# Patient Record
Sex: Male | Born: 1960
Health system: Southern US, Community
[De-identification: ages and names within clinical notes are randomized; demographics above are authoritative.]

## PROBLEM LIST (undated history)

## (undated) DIAGNOSIS — E039 Hypothyroidism, unspecified: Secondary | ICD-10-CM

## (undated) HISTORY — DX: Hypothyroidism, unspecified: E03.9

---

## 1992-10-09 HISTORY — PX: TOTAL THYMECTOMY: SHX2546

## 2002-04-25 ENCOUNTER — Ambulatory Visit (HOSPITAL_COMMUNITY): Admission: RE | Admit: 2002-04-25 | Discharge: 2002-04-25 | Payer: Self-pay | Admitting: Internal Medicine

## 2009-04-15 ENCOUNTER — Ambulatory Visit: Payer: Self-pay | Admitting: Internal Medicine

## 2009-04-29 ENCOUNTER — Telehealth (INDEPENDENT_AMBULATORY_CARE_PROVIDER_SITE_OTHER): Payer: Self-pay | Admitting: Radiology

## 2009-05-03 ENCOUNTER — Ambulatory Visit: Payer: Self-pay

## 2009-05-03 ENCOUNTER — Encounter: Payer: Self-pay | Admitting: Cardiovascular Disease

## 2009-05-27 ENCOUNTER — Ambulatory Visit: Payer: Self-pay | Admitting: Internal Medicine

## 2009-05-27 LAB — CONVERTED CEMR LAB
Albumin: 4 g/dL (ref 3.5–5.2)
Basophils Absolute: 0 10*3/uL (ref 0.0–0.1)
Basophils Relative: 0.3 % (ref 0.0–3.0)
Bilirubin Urine: NEGATIVE
Bilirubin, Direct: 0 mg/dL (ref 0.0–0.3)
Calcium: 8.9 mg/dL (ref 8.4–10.5)
Cholesterol: 207 mg/dL — ABNORMAL HIGH (ref 0–200)
Eosinophils Absolute: 0.3 10*3/uL (ref 0.0–0.7)
GFR calc non Af Amer: 84.56 mL/min (ref >60)
Glucose, Bld: 93 mg/dL (ref 70–99)
HDL: 31.2 mg/dL — ABNORMAL LOW (ref >39.00)
Leukocytes, UA: NEGATIVE
Lymphocytes Relative: 34 % (ref 12.0–46.0)
MCHC: 34.1 g/dL (ref 30.0–36.0)
MCV: 92.2 fL (ref 78.0–100.0)
Monocytes Absolute: 0.5 10*3/uL (ref 0.1–1.0)
Neutrophils Relative %: 53.5 % (ref 43.0–77.0)
Nitrite: NEGATIVE
PSA: 0.71 ng/mL (ref 0.10–4.00)
Potassium: 3.9 meq/L (ref 3.5–5.1)
RBC: 4.75 M/uL (ref 4.22–5.81)
RDW: 11.7 % (ref 11.5–14.6)
Sodium: 139 meq/L (ref 135–145)
Total Protein: 7 g/dL (ref 6.0–8.3)
Triglycerides: 153 mg/dL — ABNORMAL HIGH (ref 0.0–149.0)
VLDL: 30.6 mg/dL (ref 0.0–40.0)

## 2009-06-01 ENCOUNTER — Ambulatory Visit: Payer: Self-pay | Admitting: Internal Medicine

## 2010-02-08 ENCOUNTER — Ambulatory Visit: Payer: Self-pay | Admitting: Internal Medicine

## 2010-02-08 DIAGNOSIS — Z8669 Personal history of other diseases of the nervous system and sense organs: Secondary | ICD-10-CM | POA: Insufficient documentation

## 2010-02-08 DIAGNOSIS — J069 Acute upper respiratory infection, unspecified: Secondary | ICD-10-CM | POA: Insufficient documentation

## 2010-02-08 DIAGNOSIS — G7000 Myasthenia gravis without (acute) exacerbation: Secondary | ICD-10-CM

## 2010-11-10 NOTE — Assessment & Plan Note (Signed)
Summary: cough/fever/njr   Vital Signs:  Patient profile:   50 year old male Weight:      210 pounds O2 Sat:      97 % on Room air Temp:     98.2 degrees F oral Pulse rate:   88 / minute BP sitting:   120 / 84  (right arm) Cuff size:   regular  Vitals Entered By: Romualdo Bolk, CMA (AAMA) (Feb 08, 2010 2:11 PM)  O2 Flow:  Room air CC: Coughing, congestion, fever x 2 days. Fever on 5/2 102.0, no sob or wheezing.   History of Present Illness: Christopher Blair comesin comes in today  for acute visit  acute onset of cough and then fever 102  and ur congestion  .. chills and then sweats  . went to work and then went home because was feeling badly. no hx of  Lung disease.    asthma or exposures  No    tick bites      Tried Antihistamines for drainage and ibuprofen. Hx pf myasthenia.  So avoids some meds. such as narcotics.  Preventive Screening-Counseling & Management  Alcohol-Tobacco     Smoking Status: quit  Caffeine-Diet-Exercise     Does Patient Exercise: no     Exercise (avg: min/session): 11:00  Current Medications (verified): 1)  None  Allergies (verified): 1)  ! * Narcotics  Past History:  Past medical, surgical, family and social histories (including risk factors) reviewed, and no changes noted (except as noted below).  Past Medical History: 1994 myasthenia Chest pain /stress echo  nl  2010  Past Surgical History: Reviewed history from 04/15/2009 and no changes required. thymectomy (myasthenia)  Family History: Reviewed history from 04/15/2009 and no changes required. father - deceasedMI 28 yo mother - deceased 69 yo Breast CA  Social History: Reviewed history from 04/15/2009 and no changes required. married Married Former Smoker Regular exercise-no [place] 5    Review of Systems       The patient complains of anorexia and fever.  The patient denies chest pain, syncope, dyspnea on exertion, peripheral edema, prolonged cough, headaches,  hemoptysis, abdominal pain, abnormal bleeding, enlarged lymph nodes, and angioedema.         no rashes   Physical Exam  General:  mildly ill non toxic in nad  congested  Head:  Normocephalic and atraumatic without obvious abnormalities. No apparent alopecia or balding. Eyes:  PERRL, EOMs full, conjunctiva clear  Ears:  R ear normal, L ear normal, and no external deformities.   Nose:  no external deformity and no external erythema.  congested   face non tender Mouth:  mild erythema no lesion Neck:  No deformities, masses, tender ac area Lungs:  Normal respiratory effort, chest expands symmetrically. Lungs are clear to auscultation, no crackles or wheezes.no dullness.   Heart:  Normal rate and regular rhythm. S1 and S2 normal without gallop, murmur, click, rub or other extra sounds. Msk:  no joint swelling, no joint warmth, and no redness over joints.   Pulses:  pulses intact without delay   Extremities:  no clubbing cyanosis or edema  Neurologic:  non focal  Skin:  turgor normal, color normal, no ecchymoses, and no petechiae.   Cervical Nodes:  No lymphadenopathy noted shoddy  Psych:  Oriented X3, good eye contact, not anxious appearing, and not depressed appearing.     Impression & Recommendations:  Problem # 1:  FEVER (ICD-780.60) flu like    illness  no  other alarm findings   will observe closely  and treat sympomatically   Problem # 2:  URI (ICD-465.9) Assessment: Comment Only  Problem # 3:  Hx of MYASTHENIA (ICD-358.1) Assessment: Comment Only  Patient Instructions: 1)  I think this a viral respiratory infection and fever should be resolved in another 48 hours  2)  call if fever not gone by then. For possible reevaluation  3)  symptoms rx for now and fluids and rest.

## 2010-11-10 NOTE — Letter (Signed)
Summary: Out of Work  [facility] [employer]  [address]   [place], [place] [address]   Phone: [phone]  Fax: [phone]    Feb 08, 2010   Employee:  JARRICK FJELD    To Whom It May Concern:   For Medical reasons, please excuse the above named employee from work for the following dates:  Start:   Feb 08 2010  End:   May 5th if fever is gone   If you need additional information, please feel free to contact our office.         Sincerely,    Madelin Headings MD

## 2011-02-24 ENCOUNTER — Ambulatory Visit (INDEPENDENT_AMBULATORY_CARE_PROVIDER_SITE_OTHER): Payer: Managed Care, Other (non HMO) | Admitting: Internal Medicine

## 2011-02-24 ENCOUNTER — Encounter: Payer: Self-pay | Admitting: Internal Medicine

## 2011-02-24 VITALS — BP 110/80 | HR 65 | Ht 68.0 in | Wt 216.0 lb

## 2011-02-24 DIAGNOSIS — R0789 Other chest pain: Secondary | ICD-10-CM

## 2011-02-24 DIAGNOSIS — H532 Diplopia: Secondary | ICD-10-CM

## 2011-02-24 DIAGNOSIS — R6884 Jaw pain: Secondary | ICD-10-CM

## 2011-02-24 DIAGNOSIS — M7710 Lateral epicondylitis, unspecified elbow: Secondary | ICD-10-CM

## 2011-02-24 DIAGNOSIS — IMO0002 Reserved for concepts with insufficient information to code with codable children: Secondary | ICD-10-CM

## 2011-02-26 ENCOUNTER — Encounter: Payer: Self-pay | Admitting: Internal Medicine

## 2011-02-26 DIAGNOSIS — IMO0002 Reserved for concepts with insufficient information to code with codable children: Secondary | ICD-10-CM | POA: Insufficient documentation

## 2011-02-26 DIAGNOSIS — R6884 Jaw pain: Secondary | ICD-10-CM | POA: Insufficient documentation

## 2011-02-26 DIAGNOSIS — H532 Diplopia: Secondary | ICD-10-CM | POA: Insufficient documentation

## 2011-02-26 DIAGNOSIS — R0789 Other chest pain: Secondary | ICD-10-CM | POA: Insufficient documentation

## 2011-02-26 NOTE — Assessment & Plan Note (Signed)
not associated with chest discomfort. Appears to be musculoskeletal in etiology

## 2011-02-26 NOTE — Assessment & Plan Note (Signed)
Neurologically nonfocal. History of myasthenia gravis however has been asymptomatic. Patient to call his personal physician for appointment to evaluate. If workup unrevealing proceed with neurology consult

## 2011-02-26 NOTE — Progress Notes (Signed)
  Subjective:    Patient ID: Christopher Blair, male    DOB: [birth year], 50 y.o.   MRN: [id number]  HPI Patient presents to clinic for evaluation of double vision and chest tightness. Reports double vision intermittently lasting several seconds followed by spontaneous resolution. States it occurs when he is tired. Denies other neurologic symptoms such as dysarthria, numbness, tingling, focal weakness. Has history of myasthenia gravis however has been asymptomatic for some time and was released by neurology.  Complains of intermittent episodes of chest tightness located left anterior chest. Symptoms are nonexertional without radiation shortness of breath or diaphoresis. May last up to one hour. Denies GERD symptoms. States it occurs during times of increased stress.  Notes last week during the night on one single occasion had medial leg pain it appeared to be possibly radicular. Had no back pain, paresthesias or leg weakness. Resolved spontaneously and has had no further symptoms.  Notes recent intermittent jaw pain bilaterally roughly in the distribution of the masseter muscle. It is nonexertional and not associated with chest discomfort. It is not reproducible nor positional.  Also has bilateral lateral epicondylar pain worsened with certain movements. There has been no injury or trauma. No other alleviating or exacerbating factors.  Does note significant increased stress at work and home.  Reviewed past medical history, medications and allergies    Review of Systems see history of present illness     Objective:   Physical Exam    Physical Exam  Vitals reviewed. Constitutional:  appears well-developed and well-nourished. No distress.  HENT: jaw nontender without bony abnormality or mass. Head: Normocephalic and atraumatic.  Right Ear: Tympanic membrane, external ear and ear canal normal.  Left Ear: Tympanic membrane, external ear and ear canal normal.  Nose: Nose normal.    Mouth/Throat: Oropharynx is clear and moist. No oropharyngeal exudate.  Eyes: Conjunctivae and EOM are normal. Pupils are equal, round, and reactive to light. Right eye exhibits no discharge. Left eye exhibits no discharge. No scleral icterus.  Neck: Neck supple. No thyromegaly present.  Cardiovascular: Normal rate, regular rhythm and normal heart sounds.  Exam reveals no gallop and no friction rub.   No murmur heard. Pulmonary/Chest: Effort normal and breath sounds normal. No respiratory distress.  has no wheezes.  has no rales.  Lymphadenopathy:   no cervical adenopathy.  Neurological:  is alert. Cranial nerves II through XII grossly intact. Strength 5/5. Gait normal Skin: Skin is warm and dry.  not diaphoretic.  Psychiatric: normal mood and affect.      Assessment & Plan:

## 2011-02-26 NOTE — Assessment & Plan Note (Signed)
Begin OTC anti-inflammatories with food and no other anti-inflammatories

## 2011-02-26 NOTE — Assessment & Plan Note (Signed)
EKG obtained demonstrates normal sinus rhythm with a rate of 60. Normal intervals and axis and no evidence of acute ischemic change. Symptoms atypical. Patient notes association with stress.

## 2012-02-25 ENCOUNTER — Ambulatory Visit (INDEPENDENT_AMBULATORY_CARE_PROVIDER_SITE_OTHER): Payer: Managed Care, Other (non HMO) | Admitting: Physician Assistant

## 2012-02-25 VITALS — BP 118/72 | HR 84 | Temp 98.9°F | Resp 18 | Ht 67.0 in | Wt 215.0 lb

## 2012-02-25 DIAGNOSIS — R059 Cough, unspecified: Secondary | ICD-10-CM

## 2012-02-25 DIAGNOSIS — R05 Cough: Secondary | ICD-10-CM

## 2012-02-25 DIAGNOSIS — M25519 Pain in unspecified shoulder: Secondary | ICD-10-CM

## 2012-02-25 DIAGNOSIS — J309 Allergic rhinitis, unspecified: Secondary | ICD-10-CM

## 2012-02-25 DIAGNOSIS — M25512 Pain in left shoulder: Secondary | ICD-10-CM

## 2012-02-25 MED ORDER — GUAIFENESIN ER 1200 MG PO TB12: 1.0000 | ORAL_TABLET | Freq: Two times a day (BID) | ORAL | Status: DC | PRN

## 2012-02-25 MED ORDER — BENZONATATE 100 MG PO CAPS: 100.0000 mg | ORAL_CAPSULE | Freq: Three times a day (TID) | ORAL | Status: DC | PRN

## 2012-02-25 MED ORDER — BENZONATATE 100 MG PO CAPS: 100.0000 mg | ORAL_CAPSULE | Freq: Three times a day (TID) | ORAL | Status: AC | PRN

## 2012-02-25 MED ORDER — MELOXICAM 15 MG PO TABS: 15.0000 mg | ORAL_TABLET | Freq: Every day | ORAL | Status: DC

## 2012-02-25 MED ORDER — IPRATROPIUM BROMIDE 0.03 % NA SOLN: 2.0000 | Freq: Two times a day (BID) | NASAL | Status: DC

## 2012-02-25 NOTE — Patient Instructions (Addendum)
Get lots of rest and drink at least 64 ounces of water daily.  Use the cetirizine you have at home. You may also take a dose of Benadryl at bedtime if needed. If the shoulder pain does not resolve, return for re-evaluation.

## 2012-02-25 NOTE — Progress Notes (Signed)
  Subjective:    Patient ID: Christopher Blair, male    DOB: 04/05/1961, 51 y.o.   MRN: [id number]  HPI Nose started dripping two days ago.  Throat felt scratchy.  Post-nasal drainage. Cough is non-productive.  Mowed the lawn yesterday and symptoms worsened. Bilateral ear pain. Feels terrible. H/O myasthenia gravis and notes that since then, while he rarely gets sick, when he does, "it's really bad."  Subjective fever, chills.  No GU/GI symptoms.  Left shoulder pain with raising the arm x 1 month. No specific trauma or injury.  Pain begins when raises arm above 90 degrees.  No pain radiating down the arm, no paresthesias, no weakness.  Review of Systems As above.    Objective:   Physical Exam Vital signs noted. Well-developed, well nourished WM who is awake, alert and oriented, in NAD. HEENT: [place]/AT, PERRL, EOMI.  Sclera and conjunctiva are clear.  EAC are patent, TMs are normal in appearance. Nasal mucosa is pink and moist, but congested. OP is clear. Neck: supple, non-tender, no lymphadenopathy, thyromegaly. Heart: RRR, no murmur Lungs: CTA Extremities: no cyanosis, clubbing or edema. The left shoulder is normal in appearance.  There is no tenderness on palpation.  Anterior shoulder pain with raising the arm laterally to 90 degrees, but not anteriorly or posteriorly. Skin: warm and dry without rash.     Assessment & Plan:   1. AR (allergic rhinitis)  Guaifenesin (MUCINEX MAXIMUM STRENGTH) 1200 MG TB12, ipratropium (ATROVENT) 0.03 % nasal spray.  2. Cough  benzonatate (TESSALON) 100 MG capsule  3. Shoulder pain, left  meloxicam (MOBIC) 15 MG tablet.

## 2012-03-06 ENCOUNTER — Telehealth: Payer: Self-pay | Admitting: Internal Medicine

## 2012-03-06 NOTE — Telephone Encounter (Signed)
Pt has severe cough, difficult to talk and catch breath. Req call from nurse asap. Pt is currently taking tessalon pearls, cough syrup and nasal spray.

## 2012-03-06 NOTE — Telephone Encounter (Signed)
Spoke with patient and an appointment made 

## 2012-03-07 ENCOUNTER — Encounter: Payer: Self-pay | Admitting: Family Medicine

## 2012-03-07 ENCOUNTER — Telehealth: Payer: Self-pay | Admitting: Internal Medicine

## 2012-03-07 ENCOUNTER — Ambulatory Visit (INDEPENDENT_AMBULATORY_CARE_PROVIDER_SITE_OTHER): Payer: Managed Care, Other (non HMO) | Admitting: Family Medicine

## 2012-03-07 VITALS — BP 130/88 | HR 90 | Temp 98.6°F | Resp 16 | Wt 211.0 lb

## 2012-03-07 DIAGNOSIS — R05 Cough: Secondary | ICD-10-CM

## 2012-03-07 DIAGNOSIS — R059 Cough, unspecified: Secondary | ICD-10-CM

## 2012-03-07 MED ORDER — HYDROCODONE-HOMATROPINE 5-1.5 MG/5ML PO SYRP: 5.0000 mL | ORAL_SOLUTION | Freq: Four times a day (QID) | ORAL | Status: AC | PRN

## 2012-03-07 NOTE — Patient Instructions (Signed)

## 2012-03-07 NOTE — Telephone Encounter (Signed)
Patient has already tried Tessalon without relief. Try over-the-counter Delsym cough syrup.  Only other prescription would be things like Tussionex which he also could not take if unable to take Hydromet

## 2012-03-07 NOTE — Progress Notes (Signed)
  Subjective:    Patient ID: Christopher Blair, male    DOB: 1961-06-22, 51 y.o.   MRN: [id number]  HPI  Cough one and one half week duration. Initially started with headache, sore throat, chills, night sweats. Seen in urgent care and treated for possible allergies. He had vomiting and nausea with Mucinex. Atrovent nasal spray but has no nasal symptoms at this time. Cough is dry. Worse at night. No relief with Tessalon. No postnasal drip. No GERD. Nonsmoker. Denies dyspnea or hemoptysis. No pleuritic pain. No history of asthma.   Review of Systems  Constitutional: Negative for fever, chills, activity change, appetite change, fatigue and unexpected weight change.  HENT: Negative for ear pain, congestion, sore throat and trouble swallowing.   Respiratory: Positive for cough. Negative for shortness of breath, wheezing and stridor.   Cardiovascular: Negative for chest pain and leg swelling.  Gastrointestinal: Negative for abdominal pain.  Musculoskeletal: Negative for arthralgias.  Skin: Negative for rash.  Neurological: Negative for syncope and headaches.  Hematological: Negative for adenopathy.       Objective:   Physical Exam  Constitutional: He is oriented to person, place, and time. He appears well-developed and well-nourished. No distress.  HENT:  Head: Normocephalic and atraumatic.  Eyes: Pupils are equal, round, and reactive to light.  Neck: Normal range of motion. Neck supple. No thyromegaly present.  Cardiovascular: Normal rate and regular rhythm.   No murmur heard. Pulmonary/Chest: Effort normal. No stridor. No respiratory distress. He has no wheezes. He has no rales.  Abdominal: Soft. Bowel sounds are normal. There is no tenderness.  Musculoskeletal: He exhibits no edema.  Lymphadenopathy:    He has no cervical adenopathy.  Neurological: He is alert and oriented to person, place, and time.  Psychiatric: He has a normal mood and affect.          Assessment & Plan:    Cough. Suspect residual from acute viral bronchitis. Hycodan cough syrup 1 teaspoon every 6 hours when necessary. Followup promptly for any fever or worsening/persistent symptoms

## 2012-03-07 NOTE — Telephone Encounter (Signed)
Pt was prescribed hycodan but is unable to take because he has been dx with myastenia.  Please advise of an alternative.

## 2012-03-08 NOTE — Telephone Encounter (Signed)
This was answered yesterday.  Try Delsym cough syrup.  Pt cant take Hydrocodone and has already tried [employer].

## 2012-03-08 NOTE — Telephone Encounter (Signed)
Spoke with patient.

## 2012-03-08 NOTE — Telephone Encounter (Signed)
[facility] on [address] called and said that pt is allergic to Codeine and can not take Hycodan Syrup. P

## 2012-09-13 ENCOUNTER — Encounter: Payer: Self-pay | Admitting: Internal Medicine

## 2012-09-13 ENCOUNTER — Ambulatory Visit (INDEPENDENT_AMBULATORY_CARE_PROVIDER_SITE_OTHER): Payer: Managed Care, Other (non HMO) | Admitting: Internal Medicine

## 2012-09-13 VITALS — BP 146/90 | HR 76 | Temp 98.0°F | Resp 18 | Wt 205.0 lb

## 2012-09-13 DIAGNOSIS — M542 Cervicalgia: Secondary | ICD-10-CM

## 2012-09-13 MED ORDER — CELECOXIB 200 MG PO CAPS: 200.0000 mg | ORAL_CAPSULE | Freq: Two times a day (BID) | ORAL | Status: DC

## 2012-09-13 MED ORDER — METHYLPREDNISOLONE ACETATE 80 MG/ML IJ SUSP
80.0000 mg | Freq: Once | INTRAMUSCULAR | Status: AC
  Administered 2012-09-13: 80 mg via INTRAMUSCULAR

## 2012-09-13 NOTE — Patient Instructions (Signed)
Consider a soft cervical collar Celebrex twice daily Gentle heat and massage therapy  Call if unimproved

## 2012-09-13 NOTE — Progress Notes (Signed)
Subjective:    Patient ID: Christopher Blair, male    DOB: 1961/05/29, 51 y.o.   MRN: [id number]  HPI  52 year old patient who presents with a 4 to six-week history of neck pain and stiffness. No history of trauma. He does state that he is having slightly more increased situational stress than normal. He has remote history of myasthenia gravis that has been stable.  Past Medical History  Diagnosis Date  . Myasthenia gravis 1994    in remission since 1996    History   Social History  . Marital Status: Married    Spouse Name: N/A    Number of Children: N/A  . Years of Education: N/A   Occupational History  . Not on file.   Social History Main Topics  . Smoking status: Never Smoker   . Smokeless tobacco: Not on file  . Alcohol Use: Yes  . Drug Use: No  . Sexually Active:    Other Topics Concern  . Not on file   Social History Narrative  . No narrative on file    Past Surgical History  Procedure Date  . Total thymectomy 1994    Family History  Problem Relation Age of Onset  . Cancer Mother 70    breast ca  . Heart disease Father     AMI    Allergies  Allergen Reactions  . Mucinex (Guaifenesin Er) Other (See Comments)    vomiting    Current Outpatient Prescriptions on File Prior to Visit  Medication Sig Dispense Refill  . Guaifenesin (MUCINEX MAXIMUM STRENGTH) 1200 MG TB12 Take 1 tablet (1,200 mg total) by mouth every 12 (twelve) hours as needed.  14 tablet  1  . ipratropium (ATROVENT) 0.03 % nasal spray Place 2 sprays into the nose 2 (two) times daily.  30 mL  0  . meloxicam (MOBIC) 15 MG tablet Take 1 tablet (15 mg total) by mouth daily.  30 tablet  0    BP 146/90  Pulse 76  Temp 98 F (36.7 C) (Oral)  Resp 18  Wt 205 lb (92.987 kg)       Review of Systems  Constitutional: Negative for fever, chills, appetite change and fatigue.  HENT: Negative for hearing loss, ear pain, congestion, sore throat, trouble swallowing, neck stiffness, dental  problem, voice change and tinnitus.   Eyes: Negative for pain, discharge and visual disturbance.  Respiratory: Negative for cough, chest tightness, wheezing and stridor.   Cardiovascular: Negative for chest pain, palpitations and leg swelling.  Gastrointestinal: Negative for nausea, vomiting, abdominal pain, diarrhea, constipation, blood in stool and abdominal distention.  Genitourinary: Negative for urgency, hematuria, flank pain, discharge, difficulty urinating and genital sores.  Musculoskeletal: Negative for myalgias, back pain (neck pain), joint swelling, arthralgias and gait problem.  Skin: Negative for rash.  Neurological: Negative for dizziness, syncope, speech difficulty, weakness, numbness and headaches.  Hematological: Negative for adenopathy. Does not bruise/bleed easily.  Psychiatric/Behavioral: Negative for behavioral problems and dysphoric mood. The patient is not nervous/anxious.        Objective:   Physical Exam  Constitutional: He appears well-developed and well-nourished. No distress.       Repeat blood pressure larger cuff 120/80  Neck:       Range of motion of the neck chest and. He had full flexion and extension without discomfort. Range of motion was limited with lateral movement to the left as well as lateral movement to the right. The left affected more than the right;  pain was noted in the posterolateral neck regions with head turning          Assessment & Plan:  Neck pain.  Recommend gentle massage heat therapy. Samples of Celebrex dispensed. Will treat with Depo-Medrol 80 he will also try a soft cervical collar. If unimproved will recontact the office

## 2012-11-14 ENCOUNTER — Other Ambulatory Visit (INDEPENDENT_AMBULATORY_CARE_PROVIDER_SITE_OTHER): Payer: Managed Care, Other (non HMO)

## 2012-11-14 ENCOUNTER — Encounter: Payer: Self-pay | Admitting: Family Medicine

## 2012-11-14 ENCOUNTER — Ambulatory Visit (INDEPENDENT_AMBULATORY_CARE_PROVIDER_SITE_OTHER): Payer: Managed Care, Other (non HMO) | Admitting: Family Medicine

## 2012-11-14 VITALS — BP 120/80 | Temp 99.1°F | Wt 209.0 lb

## 2012-11-14 DIAGNOSIS — M7630 Iliotibial band syndrome, unspecified leg: Secondary | ICD-10-CM

## 2012-11-14 DIAGNOSIS — Z0000 Encounter for general adult medical examination without abnormal findings: Secondary | ICD-10-CM

## 2012-11-14 DIAGNOSIS — M629 Disorder of muscle, unspecified: Secondary | ICD-10-CM

## 2012-11-14 LAB — HEPATIC FUNCTION PANEL
Alkaline Phosphatase: 71 U/L (ref 39–117)
Bilirubin, Direct: 0.2 mg/dL (ref 0.0–0.3)
Total Bilirubin: 1.4 mg/dL — ABNORMAL HIGH (ref 0.3–1.2)
Total Protein: 7.2 g/dL (ref 6.0–8.3)

## 2012-11-14 LAB — TSH: TSH: 5.43 u[IU]/mL (ref 0.35–5.50)

## 2012-11-14 LAB — BASIC METABOLIC PANEL
Calcium: 8.9 mg/dL (ref 8.4–10.5)
Chloride: 104 mEq/L (ref 96–112)
Creatinine, Ser: 1.1 mg/dL (ref 0.4–1.5)

## 2012-11-14 LAB — CBC WITH DIFFERENTIAL/PLATELET
Basophils Relative: 0.6 % (ref 0.0–3.0)
Eosinophils Relative: 2.9 % (ref 0.0–5.0)
Lymphocytes Relative: 21.4 % (ref 12.0–46.0)
MCV: 90.9 fl (ref 78.0–100.0)
Monocytes Relative: 16.7 % — ABNORMAL HIGH (ref 3.0–12.0)
Neutrophils Relative %: 58.4 % (ref 43.0–77.0)
RBC: 5.03 Mil/uL (ref 4.22–5.81)
WBC: 6.1 10*3/uL (ref 4.5–10.5)

## 2012-11-14 LAB — PSA: PSA: 0.84 ng/mL (ref 0.10–4.00)

## 2012-11-14 LAB — LIPID PANEL
LDL Cholesterol: 121 mg/dL — ABNORMAL HIGH (ref 0–99)
Total CHOL/HDL Ratio: 6
Triglycerides: 165 mg/dL — ABNORMAL HIGH (ref 0.0–149.0)

## 2012-11-14 LAB — POCT URINALYSIS DIPSTICK
Glucose, UA: NEGATIVE
Ketones, UA: NEGATIVE
Spec Grav, UA: >=1.03

## 2012-11-14 NOTE — Patient Instructions (Addendum)
Do exercises provided daily: 1) stretches (circled) for first week 2) add rehab exercises (starred) with the stretches the 2nd week and continue until your follow up appointment  Heat for 15 minutes twice daily  Can use Tylenol 1000mg  2-3 times daily or Ibuprofen 400 mg 2-3 times daily if needed for the pain  Follow up when you come for your physical exam in about 1 month

## 2012-11-14 NOTE — Progress Notes (Signed)
Chief Complaint  Patient presents with  . Leg Pain    right thigh    HPI:  Acute visit for leg pain: -started 2 weeks ago - can not think of initiating event -R lateral upper leg -started as a stabbing pain, less now but still a throbbing pain -intermittently sometimes and sometimes constant -nothing makes it better or worse -occ numbness in R lat foot after working outside in the cold in steel toed boots outside all day -very active at work and does cycling -no fevers, chills, unintentional weight loss, weakness, or other complaints, uine or bowel incontinence   ROS: See pertinent positives and negatives per HPI.  Past Medical History  Diagnosis Date  . Myasthenia gravis 1994    in remission since 1996    Family History  Problem Relation Age of Onset  . Cancer Mother 78    breast ca  . Heart disease Father     AMI    History   Social History  . Marital Status: Married    Spouse Name: N/A    Number of Children: N/A  . Years of Education: N/A   Social History Main Topics  . Smoking status: Never Smoker   . Smokeless tobacco: None  . Alcohol Use: Yes  . Drug Use: No  . Sexually Active:    Other Topics Concern  . None   Social History Narrative  . None    Current outpatient prescriptions:celecoxib (CELEBREX) 200 MG capsule, Take 1 capsule (200 mg total) by mouth 2 (two) times daily., Disp: , Rfl: ;  Guaifenesin (MUCINEX MAXIMUM STRENGTH) 1200 MG TB12, Take 1 tablet (1,200 mg total) by mouth every 12 (twelve) hours as needed., Disp: 14 tablet, Rfl: 1;  ibuprofen (ADVIL,MOTRIN) 200 MG tablet, Take 400 mg by mouth every 6 (six) hours as needed., Disp: , Rfl:  ipratropium (ATROVENT) 0.03 % nasal spray, Place 2 sprays into the nose 2 (two) times daily., Disp: 30 mL, Rfl: 0  EXAM:  Filed Vitals:   11/14/12 1337  BP: 120/80  Temp: 99.1 F (37.3 C)    There is no height on file to calculate BMI.  GENERAL: vitals reviewed and listed above, alert, oriented,  appears well hydrated and in no acute distress  HEENT: atraumatic, conjunttiva clear, no obvious abnormalities on inspection of external nose and ears  NECK: no obvious masses on inspection  LUNGS: clear to auscultation bilaterally, no wheezes, rales or rhonchi, good air movement  CV: HRRR, no peripheral edema  MS: moves all extremities without noticeable abnormality Normal Gait Normal inspection of back, no obvious scoliosis or leg length descrepancy No bony TTP Soft tissue TTP at: IT band distal 1/3 on R -/+ tests: neg trendelenburg,-facet loading, -SLRT, -CLRT, -FABER, -FADIR Normal muscle strength, sensation to light touch and DTRs in LEs bilaterally  PSYCH: pleasant and cooperative, no obvious depression or anxiety  ASSESSMENT AND PLAN:  Discussed the following assessment and plan:  1. IT band syndrome    -exam c/w IT band strain or tendonopathy -HEP provided -supportive care and rehab care per below -Patient advised to return or notify a doctor immediately if symptoms worsen or persist or new concerns arise.  Patient Instructions  Do exercises provided daily: 1) stretches (circled) for first week 2) add rehab exercises (starred) with the stretches the 2nd week and continue until your follow up appointment  Heat for 15 minutes twice daily  Follow up when you come for your physical exam in about 1 month  Colin Benton R.

## 2012-11-20 ENCOUNTER — Encounter: Payer: Managed Care, Other (non HMO) | Admitting: Family Medicine

## 2012-11-20 ENCOUNTER — Telehealth: Payer: Self-pay | Admitting: Internal Medicine

## 2012-11-20 NOTE — Telephone Encounter (Signed)
Patient in office last week and had Labs done including urinalysis (told he had blood in his urine).  Calling to get lab results. Please review and contact patient with Lab results at [phone].

## 2012-11-25 ENCOUNTER — Other Ambulatory Visit (INDEPENDENT_AMBULATORY_CARE_PROVIDER_SITE_OTHER): Payer: Managed Care, Other (non HMO)

## 2012-11-25 DIAGNOSIS — R319 Hematuria, unspecified: Secondary | ICD-10-CM

## 2012-11-25 LAB — POCT URINALYSIS DIPSTICK
Bilirubin, UA: NEGATIVE
Glucose, UA: NEGATIVE
Ketones, UA: NEGATIVE
Spec Grav, UA: <=1.005

## 2012-11-25 NOTE — Telephone Encounter (Signed)
Per Dr Cato Mulligan, labs ok but needs to come in for a repeat urine and culture.  Pt aware, appt scheduled for today

## 2012-11-27 LAB — URINE CULTURE
Colony Count: NO GROWTH
Organism ID, Bacteria: NO GROWTH

## 2012-12-06 ENCOUNTER — Telehealth: Payer: Self-pay | Admitting: Internal Medicine

## 2012-12-06 NOTE — Telephone Encounter (Signed)
Please call patient back w/results of urine culture.

## 2012-12-06 NOTE — Telephone Encounter (Signed)
Gave pt negative urine culture results but he wants to know what would've caused the hematuria

## 2012-12-08 NOTE — Telephone Encounter (Signed)
There is no specific explanation If he would like another opinion, refer to urology

## 2012-12-09 NOTE — Telephone Encounter (Signed)
Pt aware, he will just wait till his physical and discuss it with Dr Cato Mulligan

## 2013-01-07 ENCOUNTER — Telehealth: Payer: Self-pay | Admitting: Internal Medicine

## 2013-01-07 ENCOUNTER — Encounter: Payer: Managed Care, Other (non HMO) | Admitting: Internal Medicine

## 2013-01-07 NOTE — Telephone Encounter (Addendum)
Pt missed his CPX this am. Would like earlier appt than June which is first thing available. Pt said he got tied up at work.Pls advise. Pt put on wait list.

## 2013-01-07 NOTE — Telephone Encounter (Signed)
Do you want to work pt in somewhere?  He no showed this morning

## 2013-01-07 NOTE — Telephone Encounter (Signed)
See if padonda can work him in

## 2013-01-08 NOTE — Telephone Encounter (Signed)
Called pt, mailbox full, could not accept messages.

## 2013-01-17 NOTE — Telephone Encounter (Signed)
Mailbox full/kh

## 2013-03-12 ENCOUNTER — Telehealth: Payer: Self-pay | Admitting: Internal Medicine

## 2013-03-12 NOTE — Telephone Encounter (Addendum)
Pt would like to switch to Dr Caryl Never from Dr Cato Mulligan? Is that OK with you Dr Cato Mulligan? Is that OK with you Dr Caryl Never?  The rest of his family sees you Dr Caryl Never.  Also, pt has been trying since Feb. for a physical and it has been rescheduled 3 times.

## 2013-03-12 NOTE — Telephone Encounter (Signed)
Ok per Dr Swords 

## 2013-03-12 NOTE — Telephone Encounter (Signed)
OK with me.

## 2013-03-14 NOTE — Telephone Encounter (Signed)
appt set. Pt happy.

## 2013-03-19 ENCOUNTER — Encounter: Payer: Managed Care, Other (non HMO) | Admitting: Internal Medicine

## 2013-03-26 ENCOUNTER — Encounter: Payer: Managed Care, Other (non HMO) | Admitting: Family Medicine

## 2013-03-28 ENCOUNTER — Encounter: Payer: Self-pay | Admitting: Internal Medicine

## 2013-03-28 ENCOUNTER — Ambulatory Visit (INDEPENDENT_AMBULATORY_CARE_PROVIDER_SITE_OTHER): Payer: Managed Care, Other (non HMO) | Admitting: Internal Medicine

## 2013-03-28 VITALS — BP 110/82 | HR 72 | Temp 98.0°F | Ht 68.5 in | Wt 203.0 lb

## 2013-03-28 DIAGNOSIS — Z0000 Encounter for general adult medical examination without abnormal findings: Secondary | ICD-10-CM

## 2013-03-28 DIAGNOSIS — M25579 Pain in unspecified ankle and joints of unspecified foot: Secondary | ICD-10-CM

## 2013-03-28 DIAGNOSIS — R200 Anesthesia of skin: Secondary | ICD-10-CM

## 2013-03-28 DIAGNOSIS — R209 Unspecified disturbances of skin sensation: Secondary | ICD-10-CM

## 2013-03-28 LAB — CBC WITH DIFFERENTIAL/PLATELET
Basophils Relative: 0.6 % (ref 0.0–3.0)
Eosinophils Absolute: 0.2 10*3/uL (ref 0.0–0.7)
Eosinophils Relative: 2.4 % (ref 0.0–5.0)
HCT: 45.1 % (ref 39.0–52.0)
Lymphs Abs: 1.8 10*3/uL (ref 0.7–4.0)
MCHC: 33.8 g/dL (ref 30.0–36.0)
MCV: 93.9 fl (ref 78.0–100.0)
Monocytes Absolute: 0.6 10*3/uL (ref 0.1–1.0)
Neutrophils Relative %: 65 % (ref 43.0–77.0)
Platelets: 281 10*3/uL (ref 150.0–400.0)
RBC: 4.8 Mil/uL (ref 4.22–5.81)
WBC: 7.7 10*3/uL (ref 4.5–10.5)

## 2013-03-28 LAB — POCT URINALYSIS DIPSTICK
Bilirubin, UA: NEGATIVE
Glucose, UA: NEGATIVE
Ketones, UA: NEGATIVE
Leukocytes, UA: NEGATIVE
Nitrite, UA: NEGATIVE

## 2013-03-28 LAB — HEPATIC FUNCTION PANEL
ALT: 23 U/L (ref 0–53)
Alkaline Phosphatase: 70 U/L (ref 39–117)
Bilirubin, Direct: 0.1 mg/dL (ref 0.0–0.3)
Total Bilirubin: 1.2 mg/dL (ref 0.3–1.2)
Total Protein: 7.5 g/dL (ref 6.0–8.3)

## 2013-03-28 LAB — LDL CHOLESTEROL, DIRECT: Direct LDL: 136.2 mg/dL

## 2013-03-28 LAB — BASIC METABOLIC PANEL
CO2: 28 mEq/L (ref 19–32)
Chloride: 104 mEq/L (ref 96–112)
Creatinine, Ser: 1 mg/dL (ref 0.4–1.5)
Sodium: 140 mEq/L (ref 135–145)

## 2013-03-28 LAB — LIPID PANEL
Total CHOL/HDL Ratio: 7
Triglycerides: 301 mg/dL — ABNORMAL HIGH (ref 0.0–149.0)

## 2013-03-28 NOTE — Progress Notes (Signed)
Patient ID: Christopher Blair, male   DOB: 1960/10/27, 52 y.o.   MRN: [id number]  Comes in for cpx but has some strange complaints  1)- numbness right side of face associated with slurred speech and dizziness- ongoing for 2 weeks.   2) bilateral , intermittent ankle pain for 3 months.   3) hx myasthenia  Past Medical History  Diagnosis Date  . Myasthenia gravis 1994    in remission since 1996    History   Social History  . Marital Status: Married    Spouse Name: N/A    Number of Children: N/A  . Years of Education: N/A   Occupational History  . Not on file.   Social History Main Topics  . Smoking status: Never Smoker   . Smokeless tobacco: Not on file  . Alcohol Use: Yes  . Drug Use: No  . Sexually Active:    Other Topics Concern  . Not on file   Social History Narrative  . No narrative on file    Past Surgical History  Procedure Laterality Date  . Total thymectomy  1994    Family History  Problem Relation Age of Onset  . Cancer Mother 70    breast ca  . Heart disease Father     AMI    Allergies  Allergen Reactions  . Mucinex (Guaifenesin Er) Other (See Comments)    vomiting    Current Outpatient Prescriptions on File Prior to Visit  Medication Sig Dispense Refill  . ibuprofen (ADVIL,MOTRIN) 200 MG tablet Take 400 mg by mouth every 6 (six) hours as needed.       No current facility-administered medications on file prior to visit.     patient denies chest pain, shortness of breath, orthopnea. Denies lower extremity edema, abdominal pain, change in appetite, change in bowel movements. Patient denies rashes, musculoskeletal complaints. No other specific complaints in a complete review of systems.   BP 110/82  Pulse 72  Temp(Src) 98 F (36.7 C) (Oral)  Ht 5' 8.5" (1.74 m)  Wt 203 lb (92.08 kg)  BMI 30.41 kg/m2  well-developed well-nourished male in no acute distress. HEENT exam atraumatic, normocephalic, neck supple without jugular venous  distention. Chest clear to auscultation cardiac exam S1-S2 are regular. Abdominal exam overweight with bowel sounds, soft and nontender. Extremities no edema. Neurologic exam is alert with a normal gait.  Well visit- health maint utd-  Ankle pain- bilateral. Left ankle hurts constantly and right ankle is intermittent  Neurologic complaints--? Cause. No s/s of bell's palsy. Probably should review with neurology. ? Related to myasthenia.

## 2013-04-01 ENCOUNTER — Telehealth: Payer: Self-pay | Admitting: Internal Medicine

## 2013-04-01 NOTE — Telephone Encounter (Signed)
PT is calling to inquire about his lab results from 03/28/13. Please assist.

## 2013-04-02 ENCOUNTER — Encounter: Payer: Self-pay | Admitting: Internal Medicine

## 2013-04-02 NOTE — Telephone Encounter (Signed)
Gave pt normal lab results. 

## 2013-04-21 ENCOUNTER — Other Ambulatory Visit: Payer: Self-pay | Admitting: *Deleted

## 2013-04-21 MED ORDER — LEVOTHYROXINE SODIUM 50 MCG PO TABS: 50.0000 ug | ORAL_TABLET | Freq: Every day | ORAL | Status: DC

## 2013-04-22 ENCOUNTER — Ambulatory Visit (INDEPENDENT_AMBULATORY_CARE_PROVIDER_SITE_OTHER): Payer: Self-pay | Admitting: Neurology

## 2013-04-22 ENCOUNTER — Encounter: Payer: Self-pay | Admitting: Neurology

## 2013-04-22 VITALS — BP 110/70 | HR 80 | Temp 97.7°F | Ht 68.0 in | Wt 203.0 lb

## 2013-04-22 DIAGNOSIS — R200 Anesthesia of skin: Secondary | ICD-10-CM

## 2013-04-22 DIAGNOSIS — R42 Dizziness and giddiness: Secondary | ICD-10-CM

## 2013-04-22 DIAGNOSIS — R209 Unspecified disturbances of skin sensation: Secondary | ICD-10-CM

## 2013-04-22 DIAGNOSIS — G733 Myasthenic syndromes in other diseases classified elsewhere: Secondary | ICD-10-CM

## 2013-04-22 NOTE — Patient Instructions (Addendum)
I would first like to rule out any abnormalities in the brain that may cause this, as well as investigate for simple partial seizures. 1.  MRI of brain with and without contrast with the [facility] protocol 2.  Sleep-deprived EEG.  Find out ASAP if you still have health insurance and if you do, contact us immediately so we can schedule these tests.

## 2013-04-22 NOTE — Progress Notes (Signed)
NEUROLOGY CONSULTATION NOTE  Christopher Blair MRN: [id number] DOB: [birth year]  Referring provider: Dr. Cato Mulligan Primary care provider: Dr. Cato Mulligan  Reason for consult:  Episodic right facial paresthesias and lightheadedness.  HISTORY OF PRESENT ILLNESS: Christopher Blair is a 52 y.o. male with history of myasthenia gravis who presents with right facial numbness associated with slurred speech and dizziness.  Symptoms started approximately 2 months ago. He will note a numbness and tingling sensation on the tip of his nose that will travel down the right side of his nose and his nasolabial fold. This would be associated with a lightheadedness. It would usually last approximately a couple of minutes. Initially it occurred twice a day and has more recently occurred up to 12 times a day.  There is no associated headaches, nausea ,vertigo,visual disturbance, strange taste, epigastric rising, or loss of consciousness. On one occasion, he had associated slurred speech and had to hold on to keep his balance.  He didn't start any new medications prior to onset. He did not have any injury. He endorses some anxiety, mainly about work.  He was recently diagnosed with hypothyroidism.    Patient has history of myasthenia gravis s/p thymectomy, in remission since 1996.  Not on cholinesterase inhibitors.  He does have a history of migraines that only occur rarely.  His son has a history of migraines and his daughter has history of anxiety, both take medications. There is no other family history of neurological problems. He has no prior history of seizures.  Unfortunately, Christopher Blair found out that he lost his job today. He is not sure if he still has health insurance coverage after today.  Labs 03/28/13: TSH 8.25, ESR 16.  PAST MEDICAL HISTORY: Past Medical History  Diagnosis Date  . Myasthenia gravis 1994    in remission since 1996    PAST SURGICAL HISTORY: Past Surgical History  Procedure Laterality  Date  . Total thymectomy  1994    MEDICATIONS: Current Outpatient Prescriptions on File Prior to Visit  Medication Sig Dispense Refill  . ibuprofen (ADVIL,MOTRIN) 200 MG tablet Take 400 mg by mouth every 6 (six) hours as needed.      Marland Kitchen levothyroxine (SYNTHROID, LEVOTHROID) 50 MCG tablet Take 1 tablet (50 mcg total) by mouth daily.  90 tablet  3   No current facility-administered medications on file prior to visit.    ALLERGIES: Allergies  Allergen Reactions  . Mucinex (Guaifenesin Er) Other (See Comments)    vomiting    FAMILY HISTORY: Family History  Problem Relation Age of Onset  . Cancer Mother 21    breast ca  . Heart disease Father     AMI    SOCIAL HISTORY: History   Social History  . Marital Status: Married    Spouse Name: N/A    Number of Children: N/A  . Years of Education: N/A   Occupational History  . Not on file.   Social History Main Topics  . Smoking status: Never Smoker   . Smokeless tobacco: Never Used  . Alcohol Use: Yes     Comment: occasional  . Drug Use: No  . Sexually Active: Not on file   Other Topics Concern  . Not on file   Social History Narrative  . No narrative on file    REVIEW OF SYSTEMS: Constitutional: No fevers, chills, or sweats, no generalized fatigue, change in appetite Eyes: No visual changes, double vision, eye pain Ear, nose and throat: No hearing loss, ear pain,  nasal congestion, sore throat Cardiovascular: No chest pain, palpitations Respiratory:  No shortness of breath at rest or with exertion, wheezes GastrointestinaI: No nausea, vomiting, diarrhea, abdominal pain, fecal incontinence Genitourinary:  No dysuria, urinary retention or frequency Musculoskeletal:  No neck pain, back pain Integumentary: No rash, pruritus, skin lesions Neurological: as above Psychiatric: No depression, insomnia, anxiety Endocrine: No palpitations, fatigue, diaphoresis, mood swings, change in appetite, change in weight, increased  thirst Hematologic/Lymphatic:  No anemia, purpura, petechiae. Allergic/Immunologic: no itchy/runny eyes, nasal congestion, recent allergic reactions, rashes  PHYSICAL EXAM: Filed Vitals:   04/22/13 1423  BP: 110/70  Pulse: 80  Temp: 97.7 F (36.5 C)   General: No acute distress Head:  Normocephalic/atraumatic Neck: supple, no paraspinal tenderness, full range of motion Back: No paraspinal tenderness Heart: regular rate and rhythm Lungs: Clear to auscultation bilaterally. Vascular: No carotid bruits. Neurological Exam: Mental status: alert and oriented to person, place, time and self, speech fluent and not dysarthric, language intact. Cranial nerves: CN I: not tested CN II: pupils equal, round and reactive to light, visual fields intact, fundi unremarkable. CN III, IV, VI:  full range of motion, no nystagmus, no ptosis CN V: facial sensation intact CN VII: upper and lower face symmetric CN VIII: hearing intact CN IX, X: gag intact, uvula midline CN XI: sternocleidomastoid and trapezius muscles intact CN XII: tongue midline Bulk & Tone: normal, no fasciculations. Motor: 5/5 throughout Sensation: temperature and vibration intact Deep Tendon Reflexes: 2+ throughout, toes down going Finger to nose testing: normal Gait: normal stance and stride. Romberg negative.  IMPRESSION & PLAN: Christopher Blair is a 52 y.o. male with episode lightheadedness and right facial paresthesias.  Differential diagnosis includes simple partial seizures.  Atypical migraine is also a possibility, but less likely given the short duration of each spell.  Anxiety possibility as well.  Does not seem like TIAs.  It does not appear to be related to myasthenia gravis.    I think we need to workup potentially serious and treatable conditions.  I recommend: 1.  MRI of brain w/wo gad 2.  Sleep-deprived EEG  I asked him to contact his employer to find out if he still has health insurance coverage, and if so,  for how long.  He should then contact us immediately and we can schedule for these tests to be performed ASAP.  Thank you for allowing me to take part in the care of this patient.  Shon Millet, DO  CC: Birdie Sons, MD

## 2013-05-12 ENCOUNTER — Telehealth: Payer: Self-pay | Admitting: Neurology

## 2013-05-12 NOTE — Telephone Encounter (Signed)
Patient left a message on my voice mail stating that due to his being fired from his job and now has no insurance, he is unable at this point to afford a MRI as was recommended by Dr. Everlena Cooper at his new patient appointment. He will be unable to go forward with any additional medical testing until he gets new insurance. **Dr. Everlena Cooper, just a FYI........Marland Kitchen

## 2013-06-13 ENCOUNTER — Encounter: Payer: Managed Care, Other (non HMO) | Admitting: Internal Medicine

## 2014-01-05 ENCOUNTER — Ambulatory Visit (INDEPENDENT_AMBULATORY_CARE_PROVIDER_SITE_OTHER): Payer: BC Managed Care – PPO | Admitting: Family Medicine

## 2014-01-05 VITALS — BP 110/72 | HR 73 | Temp 97.7°F | Resp 18 | Ht 67.0 in | Wt 212.4 lb

## 2014-01-05 DIAGNOSIS — Z0000 Encounter for general adult medical examination without abnormal findings: Secondary | ICD-10-CM

## 2014-01-05 DIAGNOSIS — E039 Hypothyroidism, unspecified: Secondary | ICD-10-CM

## 2014-01-05 NOTE — Progress Notes (Signed)
I have discussed this case with Ms. Gessner, NP and agree.  

## 2014-01-05 NOTE — Progress Notes (Signed)

## 2014-01-05 NOTE — Progress Notes (Signed)
   Subjective:    Patient ID: Christopher Blair, male    DOB: Feb 16, 1961, 53 y.o.   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 Was fired from [employer]. Has been working several part time jobs.  Was started on synthroid 6/14 for elevated TSH. Has not had repeat blood work. Plans on following up with Dr. Cato MulliganSwords for primary care.   Review of Systems  Constitutional: Negative.   HENT: Negative.   Eyes: Negative.   Respiratory: Negative.   Cardiovascular: Negative.   Gastrointestinal: Negative.   Endocrine: Negative.   Genitourinary: Negative.   Musculoskeletal: Positive for joint swelling (pain in left thumb, clicking, occasional swelling. Only hurts if he bumps it. Does not interfere with daily activiities.Patient was a [employer]  high school and suspects it may be from old injury.).  Skin: Negative.   Allergic/Immunologic: Negative.   Neurological: Negative.   Hematological: Negative.   Psychiatric/Behavioral: Negative.       Objective:   Physical Exam  Vitals reviewed. Constitutional: He is oriented to person, place, and time. He appears well-developed and well-nourished.  HENT:  Head: Normocephalic and atraumatic.  Right Ear: Tympanic membrane, external ear and ear canal normal.  Left Ear: Tympanic membrane, external ear and ear canal normal.  Nose: Nose normal.  Mouth/Throat: Oropharynx is clear and moist.  Eyes: Conjunctivae are normal. Pupils are equal, round, and reactive to light. Right eye exhibits no discharge. Left eye exhibits no discharge.  Neck: Normal range of motion. Neck supple.  Cardiovascular: Normal rate, regular rhythm and normal heart sounds.   Pulmonary/Chest: Effort normal and breath sounds normal.  Abdominal: Soft. Bowel sounds are normal. He exhibits no distension and no mass. There is no tenderness. There is no rebound and no guarding.  Musculoskeletal:  Some  tenderness to palpation of left first digit PIP, slightly decreased ROM with flexion. No swelling or redness noted.  Neurological: He is alert and oriented to person, place, and time. He has normal reflexes.  Skin: Skin is warm and dry.  Psychiatric: He has a normal mood and affect. His behavior is normal. Thought content normal.       Assessment & Plan:  1. Unspecified hypothyroidism - TSH- Patient started on synthroid 7/14, will check TSH today. Patient plans to follow up with Dr. Cato MulliganSwords for regular care and to reschedule colonoscopy that was not done due to patient loosing his insurance.  -Patient left prior to receiving his paperwork  Emi Belfasteborah B. Janeese Mcgloin, FNP-BC  [facility], [facility]  01/05/2014 1:34 PM

## 2014-01-06 LAB — TSH: TSH: 8.574 u[IU]/mL — ABNORMAL HIGH (ref 0.350–4.500)

## 2014-02-25 ENCOUNTER — Ambulatory Visit (INDEPENDENT_AMBULATORY_CARE_PROVIDER_SITE_OTHER): Payer: BC Managed Care – PPO | Admitting: Family Medicine

## 2014-02-25 VITALS — BP 126/80 | HR 79 | Temp 98.4°F | Resp 17 | Ht 66.5 in | Wt 206.0 lb

## 2014-02-25 DIAGNOSIS — J302 Other seasonal allergic rhinitis: Secondary | ICD-10-CM

## 2014-02-25 DIAGNOSIS — J329 Chronic sinusitis, unspecified: Secondary | ICD-10-CM

## 2014-02-25 DIAGNOSIS — J209 Acute bronchitis, unspecified: Secondary | ICD-10-CM

## 2014-02-25 DIAGNOSIS — M79646 Pain in unspecified finger(s): Secondary | ICD-10-CM

## 2014-02-25 DIAGNOSIS — M79609 Pain in unspecified limb: Secondary | ICD-10-CM

## 2014-02-25 DIAGNOSIS — J309 Allergic rhinitis, unspecified: Secondary | ICD-10-CM

## 2014-02-25 MED ORDER — IPRATROPIUM BROMIDE 0.03 % NA SOLN: 2.0000 | Freq: Two times a day (BID) | NASAL | Status: DC

## 2014-02-25 MED ORDER — AMOXICILLIN-POT CLAVULANATE 875-125 MG PO TABS: 1.0000 | ORAL_TABLET | Freq: Two times a day (BID) | ORAL | Status: DC

## 2014-02-25 NOTE — Progress Notes (Signed)
Chief Complaint:  Chief Complaint  Patient presents with  . URI  . Nasal Congestion  . Otalgia  . Cough    HPI: Christopher Blair is a 53 y.o. male who is here for  1 week history of cold sxs: tmax 101, chills, weak extremities, started after doing lawn work. He also has ear pain. Has tried Tylenol for HA, benadryl for sleep, and also generic zyrtec.  Can't have any major narcotics. He is not allegic to any meds.   Past Medical History  Diagnosis Date  . Myasthenia gravis 1994    in remission since 1996  . Myasthenia gravis    Past Surgical History  Procedure Laterality Date  . Total thymectomy  1994   History   Social History  . Marital Status: Married    Spouse Name: N/A    Number of Children: N/A  . Years of Education: N/A   Social History Main Topics  . Smoking status: Never Smoker   . Smokeless tobacco: Never Used  . Alcohol Use: Yes     Comment: occasional  . Drug Use: No  . Sexual Activity: None   Other Topics Concern  . None   Social History Narrative  . None   Family History  Problem Relation Age of Onset  . Cancer Mother 6962    breast ca  . Heart disease Father     AMI  . Migraines Son   . Anxiety disorder Daughter    Allergies  Allergen Reactions  . Mucinex [Guaifenesin Er] Other (See Comments)    vomiting   Prior to Admission medications   Medication Sig Start Date End Date Taking? Authorizing Provider  ibuprofen (ADVIL,MOTRIN) 200 MG tablet Take 400 mg by mouth every 6 (six) hours as needed.   Yes Historical Provider, MD  levothyroxine (SYNTHROID, LEVOTHROID) 50 MCG tablet Take 1 tablet (50 mcg total) by mouth daily. 04/21/13  Yes Bruce Romilda GarretH Swords, MD     ROS: The patient denies night sweats, unintentional weight loss, chest pain, palpitations, wheezing, dyspnea on exertion, nausea, vomiting, abdominal pain, dysuria, hematuria, melena, numbness, weakness, or tingling.   All other systems have been reviewed and were otherwise  negative with the exception of those mentioned in the HPI and as above.    PHYSICAL EXAM: Filed Vitals:   02/25/14 0917  BP: 126/80  Pulse: 79  Temp: 98.4 F (36.9 C)  Resp: 17   Filed Vitals:   02/25/14 0917  Height: 5' 6.5" (1.689 m)  Weight: 206 lb (93.441 kg)   Body mass index is 32.76 kg/(m^2).  General: Alert, no acute distress HEENT:  Normocephalic, atraumatic, oropharynx patent. EOMI, PERRLA. + sinus tenderness, no exudates, TM nl Cardiovascular:  Regular rate and rhythm, no rubs murmurs or gallops.  No Carotid bruits, radial pulse intact. No pedal edema.  Respiratory: Clear to auscultation bilaterally.  No wheezes, rales, or rhonchi.  No cyanosis, no use of accessory musculature GI: No organomegaly, abdomen is soft and non-tender, positive bowel sounds.  No masses. Skin: No rashes. Neurologic: Facial musculature symmetric. Psychiatric: Patient is appropriate throughout our interaction. Lymphatic: No cervical lymphadenopathy Musculoskeletal: Gait intact.   LABS: Results for orders placed in visit on 01/05/14  TSH      Result Value Ref Range   TSH 8.574 (*) 0.350 - 4.500 uIU/mL     EKG/XRAY:   Primary read interpreted by [facility] Conley RollsLe at [facility].   ASSESSMENT/PLAN: Encounter Diagnoses  Name Primary?  .Marland Kitchen  Sinusitis Yes  . Seasonal allergies   . Acute bronchitis    Rx Augmentin Rx Atrovent Take otc antihistamine  F/u prn  Gross sideeffects, risk and benefits, and alternatives of medications d/w patient. Patient is aware that all medications have potential sideeffects and we are unable to predict every sideeffect or drug-drug interaction that may occur.  Lenell Antuhao P Le, DO 02/25/2014 9:59 AM

## 2014-03-04 ENCOUNTER — Encounter: Payer: Self-pay | Admitting: Family Medicine

## 2014-03-04 ENCOUNTER — Ambulatory Visit (INDEPENDENT_AMBULATORY_CARE_PROVIDER_SITE_OTHER): Payer: BC Managed Care – PPO | Admitting: Family Medicine

## 2014-03-04 VITALS — BP 123/74 | HR 65 | Temp 98.4°F | Ht 66.5 in | Wt 207.0 lb

## 2014-03-04 DIAGNOSIS — S0003XA Contusion of scalp, initial encounter: Secondary | ICD-10-CM

## 2014-03-04 DIAGNOSIS — S1093XA Contusion of unspecified part of neck, initial encounter: Secondary | ICD-10-CM

## 2014-03-04 DIAGNOSIS — S0083XA Contusion of other part of head, initial encounter: Secondary | ICD-10-CM

## 2014-03-04 DIAGNOSIS — S0093XA Contusion of unspecified part of head, initial encounter: Secondary | ICD-10-CM

## 2014-03-04 NOTE — Progress Notes (Signed)
Pre visit review using our clinic review tool, if applicable. No additional management support is needed unless otherwise documented below in the visit note. 

## 2014-03-04 NOTE — Progress Notes (Signed)
   Subjective:    Patient ID: Christopher Blair, male    DOB: 01-09-1961, 53 y.o.   MRN: [id number]  HPI Here to be checked after an accident at his brother's house on 02-27-14. He has been treated for a sinusitis and he has been coughing a lot. That day he had a hard coughing spell and briefly passed out, causing him to fall and strike the back of his head on the floor. His LOC lasted only a few seconds and he remembers getting up from the floor. He has had some mild posterior HAs since then but no nausea or blurred vision. No neurologic deficits. He has been working a normal schedule.    Review of Systems  Constitutional: Negative.   Eyes: Negative.   Neurological: Positive for headaches. Negative for dizziness, tremors, seizures, syncope, facial asymmetry, speech difficulty, weakness, light-headedness and numbness.       Objective:   Physical Exam  Constitutional: He is oriented to person, place, and time. He appears well-developed and well-nourished. No distress.  HENT:  Head: Normocephalic and atraumatic.  Right Ear: External ear normal.  Left Ear: External ear normal.  Nose: Nose normal.  Mouth/Throat: Oropharynx is clear and moist.  Eyes: Conjunctivae and EOM are normal. Pupils are equal, round, and reactive to light.  Neck: Normal range of motion. Neck supple.  Neurological: He is alert and oriented to person, place, and time. He has normal reflexes. No cranial nerve deficit. He exhibits normal muscle tone. Coordination normal.          Assessment & Plan:  He seems to be doing well. I do not think this was a true concussion. His syncopal spell was form coughing and there seems to be no other problem. Recheck prn

## 2014-05-20 ENCOUNTER — Ambulatory Visit (INDEPENDENT_AMBULATORY_CARE_PROVIDER_SITE_OTHER): Payer: BC Managed Care – PPO | Admitting: Family Medicine

## 2014-05-20 VITALS — BP 124/76 | HR 70 | Temp 98.0°F | Resp 18 | Ht 67.0 in | Wt 201.6 lb

## 2014-05-20 DIAGNOSIS — R05 Cough: Secondary | ICD-10-CM

## 2014-05-20 DIAGNOSIS — J3489 Other specified disorders of nose and nasal sinuses: Secondary | ICD-10-CM

## 2014-05-20 DIAGNOSIS — R0981 Nasal congestion: Secondary | ICD-10-CM

## 2014-05-20 DIAGNOSIS — R059 Cough, unspecified: Secondary | ICD-10-CM

## 2014-05-20 MED ORDER — CEFDINIR 300 MG PO CAPS: 300.0000 mg | ORAL_CAPSULE | Freq: Two times a day (BID) | ORAL | Status: DC

## 2014-05-20 NOTE — Progress Notes (Signed)
[facility] [address], [place] [phone] [phone]  Date:  05/20/2014   Name:  Christopher AsalJoseph R Manrique   DOB:  22-Feb-1961   MRN:  [id number]  PCP:  Judie PetitSWORDS,BRUCE HENRY, MD    Chief Complaint: URI   History of Present Illness:  Christopher AsalJoseph R Stice is a 53 y.o. very pleasant male patient who presents with the following:  He is here today with illness for 2 days.  He notes congestion in his chest, head and throat, PND, cough. The cough can be productive.   He has some atrovent nasal spray which he just started using again.  He was not sure but thought he might have had a fever last night.   He had a similar occurrence in Spring- got more sick "because I let it go."   He has a runny nose, ears are sore.   No GI symptoms. He has a history of myasthenia, but this has been generally under control.   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  There have been sick contacts at his job  Patient Active Problem List   Diagnosis Date Noted  . Hypothyroidism 01/05/2014  . MYASTHENIA 02/08/2010    Past Medical History  Diagnosis Date  . Myasthenia gravis 1994    in remission since 1996  . Myasthenia gravis     Past Surgical History  Procedure Laterality Date  . Total thymectomy  1994    History  Substance Use Topics  . Smoking status: Never Smoker   . Smokeless tobacco: Never Used  . Alcohol Use: Yes     Comment: occasional    Family History  Problem Relation Age of Onset  . Cancer Mother 7262    breast ca  . Heart disease Father     AMI  . Migraines Son   . Anxiety disorder Daughter     Allergies  Allergen Reactions  . Mucinex [Guaifenesin Er] Other (See Comments)    vomiting    Medication list has been reviewed and updated.  Current Outpatient Prescriptions on File Prior to Visit  Medication Sig Dispense Refill  . ibuprofen (ADVIL,MOTRIN) 200 MG tablet Take 400 mg by mouth every 6 (six) hours as needed.      Marland Kitchen.  ipratropium (ATROVENT) 0.03 % nasal spray Place 2 sprays into both nostrils every 12 (twelve) hours.  30 mL  0  . levothyroxine (SYNTHROID, LEVOTHROID) 50 MCG tablet Take 1 tablet (50 mcg total) by mouth daily.  90 tablet  3   No current facility-administered medications on file prior to visit.    Review of Systems:  As per HPI- otherwise negative.   Physical Examination: Filed Vitals:   05/20/14 0842  BP: 124/76  Pulse: 70  Temp: 98 F (36.7 C)  Resp: 18   Filed Vitals:   05/20/14 0842  Height: 5\' 7"  (1.702 m)  Weight: 201 lb 9.6 oz (91.445 kg)   Body mass index is 31.57 kg/(m^2). Ideal Body Weight: Weight in (lb) to have BMI = 25: 159.3  GEN: WDWN, NAD, Non-toxic, A & O x 3, overweight, looks well HEENT: Atraumatic, Normocephalic. Neck supple. No masses, No LAD.  Bilateral TM wnl, oropharynx normal.  PEERL,EOMI.   Nasal cavity inflamed Ears and Nose: No external deformity. CV: RRR, No M/G/R. No JVD. No thrill. No extra heart sounds. PULM: CTA B, no wheezes, crackles, rhonchi. No retractions. No resp. distress. No accessory muscle use. EXTR:  No c/c/e NEURO Normal gait.  PSYCH: Normally interactive. Conversant. Not depressed or anxious appearing.  Calm demeanor.    Assessment and Plan: Cough - Plan: cefdinir (OMNICEF) 300 MG capsule  Nasal congestion - Plan: cefdinir (OMNICEF) 300 MG capsule  likely viral URI.  Reassurance- given omnicef to hold.  He is a bit more worried as he has become more sick in the pat and he has myasthenia gravis  See patient instructions for more details.       Signed Abbe Amsterdam, MD

## 2014-05-20 NOTE — Patient Instructions (Signed)
You almost certainly have a viral infection (cold).  Viral infections do not respond to antibiotics and the vast majority will resolve on their own within 7- 10 days.  However, please hold onto the omnicef rx and you may fill it if you are not getting better in the next 2 or 3 days.  Let me know if you are getting worse.  Try to rest and drink more fluids, use OTC medications as needed.

## 2014-06-16 ENCOUNTER — Other Ambulatory Visit: Payer: Self-pay | Admitting: Internal Medicine

## 2014-06-16 ENCOUNTER — Telehealth: Payer: Self-pay | Admitting: Internal Medicine

## 2014-06-16 NOTE — Telephone Encounter (Signed)
Has not been seen since June 2014.  He will need an appt

## 2014-06-16 NOTE — Telephone Encounter (Signed)
Pt needs refil on levothyroxine call into [facility]

## 2014-06-16 NOTE — Telephone Encounter (Signed)
I offered to make pt a new pt appt with a md. Pt decline

## 2014-06-17 MED ORDER — LEVOTHYROXINE SODIUM 50 MCG PO TABS: ORAL_TABLET | ORAL | Status: DC

## 2014-06-17 NOTE — Telephone Encounter (Signed)
rx sent in electronically 

## 2014-06-17 NOTE — Telephone Encounter (Signed)
Pt has been scheduled 10/19/14

## 2014-10-19 ENCOUNTER — Encounter: Payer: Self-pay | Admitting: Family Medicine

## 2014-10-19 ENCOUNTER — Ambulatory Visit (INDEPENDENT_AMBULATORY_CARE_PROVIDER_SITE_OTHER): Payer: BLUE CROSS/BLUE SHIELD | Admitting: Family Medicine

## 2014-10-19 VITALS — BP 116/84 | HR 64 | Temp 97.9°F | Ht 67.0 in | Wt 202.0 lb

## 2014-10-19 DIAGNOSIS — E034 Atrophy of thyroid (acquired): Secondary | ICD-10-CM

## 2014-10-19 DIAGNOSIS — E038 Other specified hypothyroidism: Secondary | ICD-10-CM

## 2014-10-19 DIAGNOSIS — IMO0001 Reserved for inherently not codable concepts without codable children: Secondary | ICD-10-CM

## 2014-10-19 DIAGNOSIS — Z8249 Family history of ischemic heart disease and other diseases of the circulatory system: Secondary | ICD-10-CM

## 2014-10-19 DIAGNOSIS — Z0000 Encounter for general adult medical examination without abnormal findings: Secondary | ICD-10-CM

## 2014-10-19 LAB — COMPREHENSIVE METABOLIC PANEL
ALK PHOS: 74 U/L (ref 39–117)
ALT: 24 U/L (ref 0–53)
AST: 20 U/L (ref 0–37)
Albumin: 4.6 g/dL (ref 3.5–5.2)
BILIRUBIN TOTAL: 1.3 mg/dL — AB (ref 0.2–1.2)
BUN: 13 mg/dL (ref 6–23)
CO2: 29 meq/L (ref 19–32)
CREATININE: 1 mg/dL (ref 0.4–1.5)
Calcium: 9.6 mg/dL (ref 8.4–10.5)
Chloride: 105 mEq/L (ref 96–112)
GFR: 81.82 mL/min (ref >60.00)
GLUCOSE: 88 mg/dL (ref 70–99)
POTASSIUM: 4.8 meq/L (ref 3.5–5.1)
Sodium: 141 mEq/L (ref 135–145)
Total Protein: 7.8 g/dL (ref 6.0–8.3)

## 2014-10-19 LAB — CBC
HEMATOCRIT: 48.5 % (ref 39.0–52.0)
Hemoglobin: 16.1 g/dL (ref 13.0–17.0)
MCHC: 33.3 g/dL (ref 30.0–36.0)
MCV: 93.9 fl (ref 78.0–100.0)
Platelets: 266 10*3/uL (ref 150.0–400.0)
RBC: 5.17 Mil/uL (ref 4.22–5.81)
RDW: 13 % (ref 11.5–15.5)
WBC: 7.7 10*3/uL (ref 4.0–10.5)

## 2014-10-19 LAB — PSA: PSA: 0.95 ng/mL (ref 0.10–4.00)

## 2014-10-19 LAB — TSH: TSH: 25.09 u[IU]/mL — ABNORMAL HIGH (ref 0.35–4.50)

## 2014-10-19 NOTE — Patient Instructions (Signed)
Thanks for coming in for your physical.   Update all fasting bloodwork today.   Should hear from us by Friday at the latest and we will tell you if you need and how much thyroid medicine.   Also may consider cholesterol medicine. If we do not do cholesterol medicine, would at least do an asa 81mg  per day just with family history.   You declined flu and colonoscopy but let us know and we can always set these up later.   Once a year at least possibly twice a year depending on thyroid.

## 2014-10-19 NOTE — Progress Notes (Signed)
Christopher ConchStephen Hunter, MD Phone: [phone]  Subjective:  Patient presents today to establish care with me as their new primary care provider. He is also here for his annual physical and wants to discuss elbow pain. Patient was formerly a patient of Dr. Cato MulliganSwords. Chief complaint-noted.   CPE  Walks 6-7 miles per day per his fitbit (active at work) Strong family history of MI with dad at age 54 in nonsmoker and died at 7462.  Not on asa or statin.  Eats well most days but Sunday Declines colonoscopy and flu shot ROS-no cp, sob, doe (flight of steps without difficulty). Full ROS negative except as noted in ROS  Hypothyroidism-previously mild poor control and out of medications so likely poor control On thyroid medication-not at present. Lost his job last year, insurance just kicked in. Has been off of levothyroxine. Has been out for at least a month. ROS-No hair or nail changes. No heat/cold intolerance. No constipation or diarrhea. Denies shakiness or anxiety. Has not gained significant weight. No palpitations.   Left Elbow pain Especially with turning doorknops. Present for a few months with Some intermittent pain after hitting elbow a few months ago.  ROS- no paresthesias  The following were reviewed and entered/updated in epic: Past Medical History  Diagnosis Date  . Myasthenia gravis 1994    in remission since 1996  . Hypothyroidism    Patient Active Problem List   Diagnosis Date Noted  . Family history of MI (myocardial infarction) 10/19/2014    Priority: Medium  . Hypothyroidism 01/05/2014    Priority: Medium  . Myasthenia gravis 02/08/2010    Priority: Low   Past Surgical History  Procedure Laterality Date  . Total thymectomy  1994    Family History  Problem Relation Age of Onset  . Cancer Mother 3062    breast ca  . Heart disease Father     AMI-61, nonsmoker, died 2962  . Migraines Son   . Anxiety disorder Daughter     Medications- reviewed and updated Current  Outpatient Prescriptions  Medication Sig Dispense Refill  . ibuprofen (ADVIL,MOTRIN) 200 MG tablet Take 400 mg by mouth every 6 (six) hours as needed.    Marland Kitchen. ipratropium (ATROVENT) 0.03 % nasal spray Place 2 sprays into both nostrils every 12 (twelve) hours. 30 mL 0   Allergies-reviewed and updated Allergies  Allergen Reactions  . Mucinex [Guaifenesin Er] Other (See Comments)    vomiting    History   Social History  . Marital Status: Married    Spouse Name: N/A    Number of Children: N/A  . Years of Education: N/A   Social History Main Topics  . Smoking status: Never Smoker   . Smokeless tobacco: Never Used  . Alcohol Use: Yes     Comment: occasional  . Drug Use: No  . Sexual Activity: None   Other Topics Concern  . None   Social History Narrative   Married. 3 children. No grandkids.       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       Hobbies: essentially no hobbies as works frequently    ROS--See HPI   Objective: BP 116/84 mmHg  Pulse 64  Temp(Src) 97.9 F (36.6 C) (Oral)  Ht 5\' 7"  (1.702 m)  Wt 202 lb (91.627 kg)  BMI 31.63 kg/m2 Gen: NAD, resting comfortably HEENT: Mucous membranes are moist. Oropharynx normal Neck: no thyromegaly CV: RRR no murmurs rubs  or gallops Lungs: CTAB no crackles, wheeze, rhonchi Abdomen: soft/nontender/nondistended/normal bowel sounds. No rebound or guarding.  GU: normal prostate exam with no nodules or enlargement Ext: no edema, left elbow with pain over lateral epicondyle. Pain worse with resisted dorsiflexion.  Skin: warm, dry, no rash  Neuro: grossly normal, moves all extremities, PERRLA   Assessment/Plan:  54 y.o. male presenting for annual physical.  Health Maintenance counseling: 1. Anticipatory guidance: Patient counseled regarding regular dental exams, wearing seatbelts.  2. Risk factor reduction:  Advised patient of need for regular exercise and diet rich  and fruits and vegetables to reduce risk of heart attack and stroke.  3. Immunizations/screenings/ancillary studies-declines flu and colonoscopy at this time   Hypothyroidism Suspect poor control as off medication x 1 month.  Check TSH today. Likely start on levothyroxine  Family history of MI (myocardial infarction) Check lipids. Discussed would likely at least start ASA but if lipids high, may start with statin and later add aspirin.   Left Elbow pain Lateral epicondylitis-advised counterforce brace. Patient states does not have time for PT or icing.   Return precautions advised. 6-12 month follow up depending on thyroid.   Fasting labs today Orders Placed This Encounter  Procedures  . CBC    [place]  . Comprehensive metabolic panel    [place]    Order Specific Question:  Has the patient fasted?    Answer:  No  . Lipid panel    [place]    Order Specific Question:  Has the patient fasted?    Answer:  No  . TSH    [place]  . PSA

## 2014-10-19 NOTE — Assessment & Plan Note (Signed)
Suspect poor control as off medication x 1 month.  Check TSH today. Likely start on levothyroxine

## 2014-10-19 NOTE — Progress Notes (Signed)
Pre visit review using our clinic review tool, if applicable. No additional management support is needed unless otherwise documented below in the visit note. 

## 2014-10-19 NOTE — Assessment & Plan Note (Signed)
Check lipids. Discussed would likely at least start ASA but if lipids high, may start with statin and later add aspirin.

## 2014-10-20 LAB — LIPID PANEL
CHOL/HDL RATIO: 8
Cholesterol: 288 mg/dL — ABNORMAL HIGH (ref 0–200)
HDL: 37.7 mg/dL — ABNORMAL LOW (ref >39.00)
LDL Cholesterol: 220 mg/dL — ABNORMAL HIGH (ref 0–99)
NonHDL: 250.3
Triglycerides: 154 mg/dL — ABNORMAL HIGH (ref 0.0–149.0)
VLDL: 30.8 mg/dL (ref 0.0–40.0)

## 2014-10-20 MED ORDER — LEVOTHYROXINE SODIUM 75 MCG PO TABS: 75.0000 ug | ORAL_TABLET | Freq: Every day | ORAL | Status: DC

## 2014-10-20 NOTE — Addendum Note (Signed)
Addended by: Shelva MajesticHUNTER, STEPHEN O on: 10/20/2014 01:10 PM   Modules accepted: Orders, SmartSet

## 2014-10-21 ENCOUNTER — Other Ambulatory Visit: Payer: Self-pay

## 2014-10-21 MED ORDER — LEVOTHYROXINE SODIUM 75 MCG PO TABS: 75.0000 ug | ORAL_TABLET | Freq: Every day | ORAL | Status: DC

## 2014-10-21 NOTE — Addendum Note (Signed)
Addended by: Lieutenant DiegoHINES, Jailee Jaquez A on: 10/21/2014 08:25 AM   Modules accepted: Orders

## 2014-11-23 ENCOUNTER — Other Ambulatory Visit: Payer: BLUE CROSS/BLUE SHIELD

## 2014-11-27 ENCOUNTER — Telehealth: Payer: Self-pay | Admitting: Family Medicine

## 2014-11-27 ENCOUNTER — Other Ambulatory Visit (INDEPENDENT_AMBULATORY_CARE_PROVIDER_SITE_OTHER): Payer: BLUE CROSS/BLUE SHIELD

## 2014-11-27 DIAGNOSIS — E034 Atrophy of thyroid (acquired): Secondary | ICD-10-CM

## 2014-11-27 DIAGNOSIS — E038 Other specified hypothyroidism: Secondary | ICD-10-CM

## 2014-11-27 LAB — TSH: TSH: 5.22 u[IU]/mL — ABNORMAL HIGH (ref 0.35–4.50)

## 2014-11-27 MED ORDER — LEVOTHYROXINE SODIUM 88 MCG PO TABS: 88.0000 ug | ORAL_TABLET | Freq: Every day | ORAL | Status: DC

## 2014-11-27 NOTE — Telephone Encounter (Signed)
LM on pt VM with Dr. Durene CalHunter instructions

## 2014-11-27 NOTE — Telephone Encounter (Signed)
I placed orders to increase levothyroxine to and for repeat TSH.   Please inform patient he needs to increase dose and come back in for labs in 6 weeks from the change. Thanks

## 2014-11-30 ENCOUNTER — Ambulatory Visit: Payer: BLUE CROSS/BLUE SHIELD | Admitting: Family Medicine

## 2014-11-30 ENCOUNTER — Telehealth: Payer: Self-pay | Admitting: Family Medicine

## 2014-11-30 NOTE — Telephone Encounter (Signed)
In your last telephone note on 11/27/14 I see where you had me inform pt that you will increase Levothyroxine but did not explain why. On his TSH from 10/19/14 you have that his thyroid is poorly controlled. Is this the cause of increase of medication so I can let the pt know the correct reason for the increase?

## 2014-11-30 NOTE — Telephone Encounter (Signed)
Pt staes he just picke dup the old rx for levothyroxine (SYNTHROID, LEVOTHROID) 75 MCG tablet .  Pt states he doesn't know why he needs to increase this med to 88mg .  Pt has his sinus glands removed in '94 and not sure if this may be connected. Would like to discuss reasons for the increase.

## 2014-11-30 NOTE — Telephone Encounter (Signed)
Pt was on schedule to see Dr. Clent RidgesFry, I called pt and left a voice message with this information.

## 2014-11-30 NOTE — Telephone Encounter (Signed)
Exactly, we are undertreating his thyroid right now. He needs a higher dose. We will need to repeat 6 weeks after he changes dose.

## 2014-12-01 NOTE — Telephone Encounter (Signed)
LM on pt VM TCB 

## 2014-12-01 NOTE — Telephone Encounter (Signed)
Pt called back and informed pt of Dr. Durene CalHunter recommendations.

## 2014-12-03 ENCOUNTER — Ambulatory Visit: Payer: BLUE CROSS/BLUE SHIELD | Admitting: Family Medicine

## 2015-01-07 ENCOUNTER — Ambulatory Visit: Payer: BLUE CROSS/BLUE SHIELD | Admitting: Family Medicine

## 2015-07-27 ENCOUNTER — Other Ambulatory Visit: Payer: Self-pay | Admitting: Family Medicine

## 2015-07-29 ENCOUNTER — Encounter: Payer: Self-pay | Admitting: Family Medicine

## 2015-07-29 ENCOUNTER — Other Ambulatory Visit (INDEPENDENT_AMBULATORY_CARE_PROVIDER_SITE_OTHER): Payer: BLUE CROSS/BLUE SHIELD

## 2015-07-29 ENCOUNTER — Other Ambulatory Visit: Payer: Self-pay | Admitting: Family Medicine

## 2015-07-29 ENCOUNTER — Ambulatory Visit (INDEPENDENT_AMBULATORY_CARE_PROVIDER_SITE_OTHER): Payer: BLUE CROSS/BLUE SHIELD | Admitting: Family Medicine

## 2015-07-29 VITALS — BP 120/82 | HR 68 | Temp 98.9°F | Wt 207.0 lb

## 2015-07-29 DIAGNOSIS — Z23 Encounter for immunization: Secondary | ICD-10-CM

## 2015-07-29 DIAGNOSIS — E038 Other specified hypothyroidism: Secondary | ICD-10-CM

## 2015-07-29 DIAGNOSIS — E034 Atrophy of thyroid (acquired): Secondary | ICD-10-CM | POA: Diagnosis not present

## 2015-07-29 DIAGNOSIS — E039 Hypothyroidism, unspecified: Secondary | ICD-10-CM

## 2015-07-29 DIAGNOSIS — E785 Hyperlipidemia, unspecified: Secondary | ICD-10-CM | POA: Diagnosis not present

## 2015-07-29 LAB — TSH: TSH: 3.68 u[IU]/mL (ref 0.35–4.50)

## 2015-07-29 MED ORDER — LEVOTHYROXINE SODIUM 88 MCG PO TABS: 88.0000 ug | ORAL_TABLET | Freq: Every day | ORAL | Status: DC

## 2015-07-29 NOTE — Assessment & Plan Note (Signed)
S: poorly controlled at last lab visit and medication was adjusted to 88 mcg. Controlled on lab today Lab Results  Component Value Date   TSH 3.68 07/29/2015  A/P: continue current rx- refilled 1 year of Levothyroxine 88mcg

## 2015-07-29 NOTE — Progress Notes (Signed)
Tana ConchStephen Biviana Saddler, MD  Subjective:  Christopher AsalJoseph R Blair is a 54 y.o. year old very pleasant male patient who presents for/with See problem oriented charting ROS-No hair or nail changes. No heat/cold intolerance. No constipation or diarrhea. Denies shakiness or anxiety. No chest pain or dyspnea on exertion.   Past Medical History-  Patient Active Problem List   Diagnosis Date Noted  . Hyperlipidemia 07/29/2015    Priority: Medium  . Family history of MI (myocardial infarction) 10/19/2014    Priority: Medium  . Hypothyroidism 01/05/2014    Priority: Medium  . Myasthenia gravis (HCC) 02/08/2010    Priority: Low    Medications- reviewed and updated Current Outpatient Prescriptions  Medication Sig Dispense Refill  . levothyroxine (SYNTHROID, LEVOTHROID) 88 MCG tablet Take 1 tablet (88 mcg total) by mouth daily. 30 tablet 11  . ibuprofen (ADVIL,MOTRIN) 200 MG tablet Take 400 mg by mouth every 6 (six) hours as needed.    Marland Kitchen. ipratropium (ATROVENT) 0.03 % nasal spray Place 2 sprays into both nostrils every 12 (twelve) hours. (Patient not taking: Reported on 07/29/2015) 30 mL 0   No current facility-administered medications for this visit.    Objective: BP 120/82 mmHg  Pulse 68  Temp(Src) 98.9 F (37.2 C)  Wt 207 lb (93.895 kg) Gen: NAD, resting comfortably CV: RRR no murmurs rubs or gallops Lungs: CTAB no crackles, wheeze, rhonchi Abdomen: soft/nontender/nondistended/normal bowel sounds. No rebound or guarding.  Ext: no edema Skin: warm, dry Neuro: grossly normal, moves all extremities  Assessment/Plan:  Hypothyroidism S: poorly controlled at last lab visit and medication was adjusted to 88 mcg. Controlled on lab today Lab Results  Component Value Date   TSH 3.68 07/29/2015  A/P: continue current rx- refilled 1 year of Levothyroxine 88mcg   Hyperlipidemia S: very poor control on last check. Of note, TSH at that time was >20 Lab Results  Component Value Date   CHOL 288*  10/19/2014   HDL 37.70* 10/19/2014   LDLCALC 220* 10/19/2014   LDLDIRECT 136.2 03/28/2013   TRIG 154.0* 10/19/2014   CHOLHDL 8 10/19/2014  A/P: Patient is going to work on weight loss but in addition, correcting hypothyroidism should normalize lipids to some degree. We will recheck at his CPE and consider statin if needed at that time. Consider 5% 10 year cut off given family history MI in father at 1961.   Remains active at work walking 6-7 miles a day but still encouraged exercise at home. Advised 81mg  aspirin daily with family history MI  Return precautions advised.   Orders Placed This Encounter  Procedures  . Flu Vaccine QUAD 36+ mos IM  . TSH   Meds ordered this encounter  Medications  . levothyroxine (SYNTHROID, LEVOTHROID) 88 MCG tablet    Sig: Take 1 tablet (88 mcg total) by mouth daily.    Dispense:  30 tablet    Refill:  11

## 2015-07-29 NOTE — Assessment & Plan Note (Signed)
S: very poor control on last check. Of note, TSH at that time was >20 Lab Results  Component Value Date   CHOL 288* 10/19/2014   HDL 37.70* 10/19/2014   LDLCALC 220* 10/19/2014   LDLDIRECT 136.2 03/28/2013   TRIG 154.0* 10/19/2014   CHOLHDL 8 10/19/2014  A/P: Patient is going to work on weight loss but in addition, correcting hypothyroidism should normalize lipids to some degree. We will recheck at his CPE and consider statin if needed at that time. Consider 5% 10 year cut off given family history MI in father at 8361.

## 2015-10-27 ENCOUNTER — Other Ambulatory Visit (INDEPENDENT_AMBULATORY_CARE_PROVIDER_SITE_OTHER): Payer: BLUE CROSS/BLUE SHIELD

## 2015-10-27 DIAGNOSIS — R7989 Other specified abnormal findings of blood chemistry: Secondary | ICD-10-CM | POA: Diagnosis not present

## 2015-10-27 DIAGNOSIS — Z0000 Encounter for general adult medical examination without abnormal findings: Secondary | ICD-10-CM | POA: Diagnosis not present

## 2015-10-27 DIAGNOSIS — R319 Hematuria, unspecified: Secondary | ICD-10-CM

## 2015-10-27 DIAGNOSIS — E039 Hypothyroidism, unspecified: Secondary | ICD-10-CM

## 2015-10-27 LAB — CBC WITH DIFFERENTIAL/PLATELET
BASOS ABS: 0 10*3/uL (ref 0.0–0.1)
BASOS PCT: 0.7 % (ref 0.0–3.0)
EOS ABS: 0.4 10*3/uL (ref 0.0–0.7)
Eosinophils Relative: 6.1 % — ABNORMAL HIGH (ref 0.0–5.0)
HCT: 45 % (ref 39.0–52.0)
Hemoglobin: 15.4 g/dL (ref 13.0–17.0)
LYMPHS ABS: 2 10*3/uL (ref 0.7–4.0)
Lymphocytes Relative: 29.2 % (ref 12.0–46.0)
MCHC: 34.2 g/dL (ref 30.0–36.0)
MCV: 91.6 fl (ref 78.0–100.0)
MONOS PCT: 8.3 % (ref 3.0–12.0)
Monocytes Absolute: 0.6 10*3/uL (ref 0.1–1.0)
NEUTROS ABS: 3.9 10*3/uL (ref 1.4–7.7)
NEUTROS PCT: 55.7 % (ref 43.0–77.0)
PLATELETS: 274 10*3/uL (ref 150.0–400.0)
RBC: 4.91 Mil/uL (ref 4.22–5.81)
RDW: 12.5 % (ref 11.5–15.5)
WBC: 7 10*3/uL (ref 4.0–10.5)

## 2015-10-27 LAB — BASIC METABOLIC PANEL
BUN: 13 mg/dL (ref 6–23)
CALCIUM: 9 mg/dL (ref 8.4–10.5)
CO2: 27 meq/L (ref 19–32)
CREATININE: 1.04 mg/dL (ref 0.40–1.50)
Chloride: 101 mEq/L (ref 96–112)
GFR: 78.8 mL/min (ref >60.00)
GLUCOSE: 99 mg/dL (ref 70–99)
Potassium: 4.3 mEq/L (ref 3.5–5.1)
Sodium: 137 mEq/L (ref 135–145)

## 2015-10-27 LAB — LIPID PANEL
CHOLESTEROL: 309 mg/dL — AB (ref 0–200)
HDL: 32.5 mg/dL — ABNORMAL LOW (ref >39.00)
Total CHOL/HDL Ratio: 10

## 2015-10-27 LAB — HEPATIC FUNCTION PANEL
ALBUMIN: 4.2 g/dL (ref 3.5–5.2)
ALK PHOS: 89 U/L (ref 39–117)
ALT: 17 U/L (ref 0–53)
AST: 16 U/L (ref 0–37)
Bilirubin, Direct: 0 mg/dL (ref 0.0–0.3)
Total Bilirubin: 0.6 mg/dL (ref 0.2–1.2)
Total Protein: 6.8 g/dL (ref 6.0–8.3)

## 2015-10-27 LAB — POCT URINALYSIS DIPSTICK
Bilirubin, UA: NEGATIVE
GLUCOSE UA: NEGATIVE
Ketones, UA: NEGATIVE
Leukocytes, UA: NEGATIVE
NITRITE UA: NEGATIVE
PH UA: 5
PROTEIN UA: NEGATIVE
SPEC GRAV UA: 1.015
UROBILINOGEN UA: 0.2

## 2015-10-27 LAB — TSH: TSH: 9.13 u[IU]/mL — ABNORMAL HIGH (ref 0.35–4.50)

## 2015-10-27 LAB — PSA: PSA: 0.67 ng/mL (ref 0.10–4.00)

## 2015-10-27 LAB — LDL CHOLESTEROL, DIRECT: LDL DIRECT: 105 mg/dL

## 2015-10-28 LAB — MICROALBUMIN / CREATININE URINE RATIO
Creatinine,U: 95.1 mg/dL
Microalb Creat Ratio: 0.7 mg/g (ref 0.0–30.0)
Microalb, Ur: <0.7 mg/dL (ref 0.0–1.9)

## 2015-10-29 ENCOUNTER — Telehealth: Payer: Self-pay

## 2015-10-29 NOTE — Telephone Encounter (Signed)
Error

## 2015-11-03 ENCOUNTER — Ambulatory Visit (INDEPENDENT_AMBULATORY_CARE_PROVIDER_SITE_OTHER): Payer: BLUE CROSS/BLUE SHIELD | Admitting: Family Medicine

## 2015-11-03 ENCOUNTER — Encounter: Payer: Self-pay | Admitting: Family Medicine

## 2015-11-03 VITALS — BP 120/86 | HR 58 | Temp 97.6°F | Ht 67.0 in | Wt 210.0 lb

## 2015-11-03 DIAGNOSIS — E039 Hypothyroidism, unspecified: Secondary | ICD-10-CM

## 2015-11-03 DIAGNOSIS — R319 Hematuria, unspecified: Secondary | ICD-10-CM | POA: Diagnosis not present

## 2015-11-03 DIAGNOSIS — Z0000 Encounter for general adult medical examination without abnormal findings: Secondary | ICD-10-CM

## 2015-11-03 DIAGNOSIS — Z1211 Encounter for screening for malignant neoplasm of colon: Secondary | ICD-10-CM

## 2015-11-03 DIAGNOSIS — E785 Hyperlipidemia, unspecified: Secondary | ICD-10-CM

## 2015-11-03 DIAGNOSIS — Z20828 Contact with and (suspected) exposure to other viral communicable diseases: Secondary | ICD-10-CM

## 2015-11-03 MED ORDER — LEVOTHYROXINE SODIUM 100 MCG PO TABS: 100.0000 ug | ORAL_TABLET | Freq: Every day | ORAL | Status: DC

## 2015-11-03 NOTE — Progress Notes (Signed)
Christopher Conch, MD Phone: [phone]  Subjective:  Patient presents today for their annual physical. Chief complaint-noted.   See problem oriented charting- ROS- full  review of systems was completed and negative except for: No chest pain or shortness of breath. No headache or blurry vision.   The following were reviewed and entered/updated in epic: Past Medical History  Diagnosis Date  . Myasthenia gravis 1994    in remission since 1996  . Hypothyroidism    Patient Active Problem List   Diagnosis Date Noted  . Hyperlipidemia 07/29/2015    Priority: Medium  . Family history of MI (myocardial infarction) 10/19/2014    Priority: Medium  . Hypothyroidism 01/05/2014    Priority: Medium  . Myasthenia gravis (HCC) 02/08/2010    Priority: Low   Past Surgical History  Procedure Laterality Date  . Total thymectomy  1994    Family History  Problem Relation Age of Onset  . Cancer Mother 20    breast ca  . Heart disease Father     AMI-61, nonsmoker, died 57  . Migraines Son     and tics  . Anxiety disorder Daughter     Medications- reviewed and updated Current Outpatient Prescriptions  Medication Sig Dispense Refill  . aspirin 81 MG tablet Take 81 mg by mouth daily.    Marland Kitchen levothyroxine (SYNTHROID, LEVOTHROID) 100 MCG tablet Take 1 tablet (100 mcg total) by mouth daily. 30 tablet 5  . ibuprofen (ADVIL,MOTRIN) 200 MG tablet Take 400 mg by mouth every 6 (six) hours as needed. Reported on 11/03/2015     No current facility-administered medications for this visit.    Allergies-reviewed and updated Allergies  Allergen Reactions  . Mucinex [Guaifenesin Er] Other (See Comments)    vomiting    Social History   Social History  . Marital Status: Married    Spouse Name: N/A  . Number of Children: N/A  . Years of Education: N/A   Social History Main Topics  . Smoking status: Never Smoker   . Smokeless tobacco: Never Used  . Alcohol Use: Yes     Comment: occasional  .  Drug Use: No  . Sexual Activity: Not Asked   Other Topics Concern  . None   Social History Narrative   Married. 3 children. No grandkids. Oldest daughter getting married in June 2017.       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       Hobbies: essentially no hobbies as works frequently   Goes to [employer]    Objective: BP 120/86 mmHg  Pulse 58  Temp(Src) 97.6 F (36.4 C)  Ht  (1.702 m)  Wt 210 lb (95.255 kg)  BMI 32.88 kg/m2 Gen: NAD, resting comfortably HEENT: Mucous membranes are moist. Oropharynx normal Neck: no thyromegaly CV: RRR no murmurs rubs or gallops Lungs: CTAB no crackles, wheeze, rhonchi Abdomen: soft/nontender/nondistended/normal bowel sounds. No rebound or guarding.  Rectal: normal tone, normal size prostate, no masses or tenderness Ext: no edema Skin: warm, dry Neuro: grossly normal, moves all extremities, PERRLA  Assessment/Plan:  55 y.o. male presenting for annual physical.  Health Maintenance counseling: 1. Anticipatory guidance: Patient counseled regarding regular dental exams, eye exams, wearing seatbelts.  2. Risk factor reduction:  Advised patient of need for regular exercise (active at work with 6-7 miles walking per day but minimal at home- had been better before holidays) and diet rich and fruits and  vegetables to reduce risk of heart attack and stroke. Weight up 8 lbs over year, patient to work to try to correct this and target back to 190 like previously had attained at home  3. Immunizations/screenings/ancillary studies Health Maintenance Due  Topic Date Due  . Hepatitis C Screening - offered  Will do with next bloodwork 05/14/1961  . HIV Screening - offered will do with next bloodwork 10/24/1975  . COLONOSCOPY - declined last year, knows he needs it, we ordered it and he will find an acceptable date 10/23/2010   4. Prostate cancer screening- low risk based off  PSA and rectal   Lab Results  Component Value Date   PSA 0.67 10/27/2015   PSA 0.95 10/19/2014   PSA 0.94 03/28/2013   5. Colon cancer screening - declined last year, referred today 6. Full skin exam- no obvious concerning lesions- 2 moles right below 6 mm on midback without evolution- will monitor  Hyperlipidemia Hyperlipidemia- poor control of thyroid could be contributing. Last LDL was far higher- I recognize this is a direct LDL but still wonder if underlying value higher. Advised start high intensity statin because I doubt >500 point reduction just off thyroid but patient declines and wants controlled thyroid medicine before making plan on lipid medicine.  Plan is to repeat TSH in [redacted] weeks along with repeat lipid panel. Dad also with MI at 45 so must be cautious. He is compliant with aspirin 81 mg.  Lab Results  Component Value Date   CHOL 309* 10/27/2015   HDL 32.50* 10/27/2015   LDLDIRECT 105.0 10/27/2015   TRIG * 10/27/2015    967.0 Triglyceride is over 400; calculations on Lipids are invalid.   CHOLHDL 10 10/27/2015    Hypothyroidism Hypothyroidism- levothyroxine 88 mcg poor control- increase to Lab Results  Component Value Date   TSH 9.13* 10/27/2015      Return in about 1 year (around 11/02/2016) for physical. labs 8 weeks though fasting. . Return precautions advised.   Orders Placed This Encounter  Procedures  . Lipid panel    Standing Status: Future     Number of Occurrences:      Standing Expiration Date: 11/02/2016  . TSH    Standing Status: Future     Number of Occurrences:      Standing Expiration Date: 11/02/2016  . Hepatitis C antibody, reflex    [facility]    Standing Status: Future     Number of Occurrences:      Standing Expiration Date: 11/02/2016  . HIV antibody    Standing Status: Future     Number of Occurrences:      Standing Expiration Date: 11/02/2016  . Ambulatory referral to Gastroenterology    Referral Priority:  Routine    Referral  Type:  Consultation    Referral Reason:  Specialty Services Required    Number of Visits Requested:  1  . POCT urinalysis dipstick    In house    Meds ordered this encounter  Medications  . levothyroxine (SYNTHROID, LEVOTHROID) 100 MCG tablet    Sig: Take 1 tablet (100 mcg total) by mouth daily.    Dispense:  30 tablet    Refill:  5

## 2015-11-03 NOTE — Assessment & Plan Note (Signed)
Hypothyroidism- levothyroxine 88 mcg poor control- increase to Lab Results  Component Value Date   TSH 9.13* 10/27/2015

## 2015-11-03 NOTE — Assessment & Plan Note (Signed)
Hyperlipidemia- poor control of thyroid could be contributing. Last LDL was far higher- I recognize this is a direct LDL but still wonder if underlying value higher. Advised start high intensity statin because I doubt >500 point reduction just off thyroid but patient declines and wants controlled thyroid medicine before making plan on lipid medicine.  Plan is to repeat TSH in [redacted] weeks along with repeat lipid panel. Dad also with MI at 31 so must be cautious. He is compliant with aspirin 81 mg.  Lab Results  Component Value Date   CHOL 309* 10/27/2015   HDL 32.50* 10/27/2015   LDLDIRECT 105.0 10/27/2015   TRIG * 10/27/2015    967.0 Triglyceride is over 400; calculations on Lipids are invalid.   CHOLHDL 10 10/27/2015

## 2015-11-03 NOTE — Patient Instructions (Addendum)
[facility] will call you to schedule colonoscopy.  Leave urine before you leave  Schedule fasting labs for 8 weeks- hopeful thyroid will normalize and we will get better picture on actual cholesterol medicine. We will do the hep C and HIV tests as well  Increase thyroid medicine to 100 mcg- sent in as #30 as we may have to adjust further.   Congrats on daughter getting married!

## 2015-11-05 ENCOUNTER — Other Ambulatory Visit: Payer: Self-pay | Admitting: Family Medicine

## 2015-11-05 DIAGNOSIS — R319 Hematuria, unspecified: Secondary | ICD-10-CM

## 2015-11-05 LAB — URINALYSIS, MICROSCOPIC ONLY

## 2015-11-05 NOTE — Addendum Note (Signed)
Addended by: Bonnye Fava on: 11/05/2015 10:34 AM   Modules accepted: Orders

## 2015-11-05 NOTE — Addendum Note (Signed)
Addended by: Bonnye Fava on: 11/05/2015 10:32 AM   Modules accepted: Orders

## 2015-12-27 ENCOUNTER — Ambulatory Visit (INDEPENDENT_AMBULATORY_CARE_PROVIDER_SITE_OTHER): Payer: BLUE CROSS/BLUE SHIELD | Admitting: Family Medicine

## 2015-12-27 ENCOUNTER — Ambulatory Visit (INDEPENDENT_AMBULATORY_CARE_PROVIDER_SITE_OTHER): Payer: BLUE CROSS/BLUE SHIELD

## 2015-12-27 VITALS — BP 118/88 | HR 61 | Temp 97.7°F | Resp 14 | Ht 67.0 in | Wt 206.0 lb

## 2015-12-27 DIAGNOSIS — M25572 Pain in left ankle and joints of left foot: Secondary | ICD-10-CM | POA: Diagnosis not present

## 2015-12-27 NOTE — Progress Notes (Signed)
[facility] [address], [address] [place], [place] [address] [phone] [phone]  Date:  12/27/2015   Name:  Christopher AsalJoseph R Dolata   DOB:  12/05/1960   MRN:  [id number]  PCP:  Tana ConchStephen Hunter, MD    Chief Complaint: Foot Pain   History of Present Illness:  Christopher AsalJoseph R Blair is a 55 y.o. very pleasant male patient who presents with the following: Rolled left ankle 4 weeks ago: - Able to walk after injury and now.  - Improving slowly - Rare movements cause pain - No pain meds - No swelling or bruising.  - Rolled ankle walking over a threshold at work.     Patient Active Problem List   Diagnosis Date Noted  . Hyperlipidemia 07/29/2015  . Family history of MI (myocardial infarction) 10/19/2014  . Hypothyroidism 01/05/2014  . Myasthenia gravis (HCC) 02/08/2010    Past Medical History  Diagnosis Date  . Myasthenia gravis 1994    in remission since 1996  . Hypothyroidism     Past Surgical History  Procedure Laterality Date  . Total thymectomy  1994    Social History  Substance Use Topics  . Smoking status: Never Smoker   . Smokeless tobacco: Never Used  . Alcohol Use: Yes     Comment: occasional    Family History  Problem Relation Age of Onset  . Cancer Mother 3762    breast ca  . Heart disease Father     AMI-61, nonsmoker, died 5262  . Migraines Son     and tics  . Anxiety disorder Daughter     Allergies  Allergen Reactions  . Mucinex [Guaifenesin Er] Other (See Comments)    vomiting    Medication list has been reviewed and updated.  Current Outpatient Prescriptions on File Prior to Visit  Medication Sig Dispense Refill  . levothyroxine (SYNTHROID, LEVOTHROID) 100 MCG tablet Take 1 tablet (100 mcg total) by mouth daily. 30 tablet 5  . aspirin 81 MG tablet Take 81 mg by mouth daily. Reported on 12/27/2015    . ibuprofen (ADVIL,MOTRIN) 200 MG tablet Take 400 mg by mouth every 6 (six) hours as needed. Reported  on 12/27/2015     No current facility-administered medications on file prior to visit.    Review of Systems:  Review of Systems  Constitutional: Negative for fever, chills and malaise/fatigue.  Respiratory: Negative for cough.   Cardiovascular: Negative for chest pain.  Gastrointestinal: Negative for nausea, vomiting and abdominal pain.  Musculoskeletal: Positive for joint pain. Negative for myalgias and falls.  Skin: Negative for rash.  Neurological: Negative for dizziness and headaches.    Physical Examination: Filed Vitals:   12/27/15 0933  BP: 118/88  Pulse: 61  Temp: 97.7 F (36.5 C)  Resp: 14   Filed Vitals:   12/27/15 0933  Height: 5\' 7"  (1.702 m)  Weight: 206 lb (93.441 kg)   Body mass index is 32.26 kg/(m^2). Ideal Body Weight: Weight in (lb) to have BMI = 25: 159.3  GEN: WDWN, NAD, Non-toxic, A & O x 3 HEENT: Atraumatic, Normocephalic.  PULM:non-labored breathing ABD: S, NT, ND, +BS. No rebound. No HSM. EXTR: No c/c/e NEURO Normal gait.  PSYCH: Normally interactive. Conversant. Not depressed or anxious appearing.  Calm demeanor.   Ankle: left No visible erythema or swelling. Range of motion is full in all directions. Strength is 5/5 in all directions. Stable lateral and medial ligaments; squeeze test and kleiger  test unremarkable; Talar dome nontender; No pain at base of 5th MT; No tenderness over cuboid; No tenderness over N spot or navicular prominence No tenderness on posterior aspects of lateral and medial malleolus No sign of peroneal tendon subluxations; Negative tarsal tunnel tinel's Able to walk 4 steps. Negative hop test.   Assessment and Plan: Left ankle sprain: No rehab. Bracing helps.  XR as persistent pain.  No fall. Full strength and RoM.  Will provided ankle rehab exercises. F/u 4 weeks if not improving and consider PT. Continue with bracing at work. Provided theraband  F/u PRN.   Signed Guinevere Scarlet, MD

## 2015-12-27 NOTE — Patient Instructions (Addendum)
   IF you received an x-ray today, you will receive an invoice from [facility]. Please contact  Radiology at [phone] with questions or concerns regarding your invoice.   IF you received labwork today, you will receive an invoice from [facility]. Please contact [facility] at [phone] with questions or concerns regarding your invoice.   Our billing staff will not be able to assist you with questions regarding bills from these companies.  You will be contacted with the lab results as soon as they are available. The fastest way to get your results is to activate your My Chart account. Instructions are located on the last page of this paperwork. If you have not heard from us regarding the results in 2 weeks, please contact this office.     Ankle Sprain An ankle sprain is an injury to the strong, fibrous tissues (ligaments) that hold the bones of your ankle joint together.  CAUSES An ankle sprain is usually caused by a fall or by twisting your ankle. Ankle sprains most commonly occur when you step on the outer edge of your foot, and your ankle turns inward. People who participate in sports are more prone to these types of injuries.  SYMPTOMS   Pain in your ankle. The pain may be present at rest or only when you are trying to stand or walk.  Swelling.  Bruising. Bruising may develop immediately or within 1 to 2 days after your injury.  Difficulty standing or walking, particularly when turning corners or changing directions. DIAGNOSIS  Your caregiver will ask you details about your injury and perform a physical exam of your ankle to determine if you have an ankle sprain. During the physical exam, your caregiver will press on and apply pressure to specific areas of your foot and ankle. Your caregiver will try to move your ankle in certain ways. An X-ray exam may be done to be sure a bone was not broken or a ligament did not separate from one  of the bones in your ankle (avulsion fracture).  TREATMENT  Certain types of braces can help stabilize your ankle. Your caregiver can make a recommendation for this. Your caregiver may recommend the use of medicine for pain. If your sprain is severe, your caregiver may refer you to a surgeon who helps to restore function to parts of your skeletal system (orthopedist) or a physical therapist. HOME CARE INSTRUCTIONS   Apply ice to your injury for 1-2 days or as directed by your caregiver. Applying ice helps to reduce inflammation and pain.  Put ice in a plastic bag.  Place a towel between your skin and the bag.  Leave the ice on for 15-20 minutes at a time, every 2 hours while you are awake.  Only take over-the-counter or prescription medicines for pain, discomfort, or fever as directed by your caregiver.  Elevate your injured ankle above the level of your heart as much as possible for 2-3 days.  If your caregiver recommends crutches, use them as instructed. Gradually put weight on the affected ankle. Continue to use crutches or a cane until you can walk without feeling pain in your ankle.  If you have a plaster splint, wear the splint as directed by your caregiver. Do not rest it on anything harder than a pillow for the first 24 hours. Do not put weight on it. Do not get it wet. You may take it off to take a shower or bath.  You may have been   given an elastic bandage to wear around your ankle to provide support. If the elastic bandage is too tight (you have numbness or tingling in your foot or your foot becomes cold and blue), adjust the bandage to make it comfortable.  If you have an air splint, you may blow more air into it or let air out to make it more comfortable. You may take your splint off at night and before taking a shower or bath. Wiggle your toes in the splint several times per day to decrease swelling. SEEK MEDICAL CARE IF:   You have rapidly increasing bruising or  swelling.  Your toes feel extremely cold or you lose feeling in your foot.  Your pain is not relieved with medicine. SEEK IMMEDIATE MEDICAL CARE IF:  Your toes are numb or blue.  You have severe pain that is increasing. MAKE SURE YOU:   Understand these instructions.  Will watch your condition.  Will get help right away if you are not doing well or get worse.   This information is not intended to replace advice given to you by your health care provider. Make sure you discuss any questions you have with your health care provider.   Document Released: 09/25/2005 Document Revised: 10/16/2014 Document Reviewed: 10/07/2011 Elsevier Interactive Patient Education 2016 Elsevier Inc.  

## 2016-05-23 ENCOUNTER — Other Ambulatory Visit: Payer: Self-pay | Admitting: Family Medicine

## 2016-08-25 ENCOUNTER — Telehealth: Payer: Self-pay | Admitting: Family Medicine

## 2016-08-25 ENCOUNTER — Encounter: Payer: Self-pay | Admitting: Family Medicine

## 2016-08-25 ENCOUNTER — Ambulatory Visit (INDEPENDENT_AMBULATORY_CARE_PROVIDER_SITE_OTHER): Payer: No Typology Code available for payment source | Admitting: Family Medicine

## 2016-08-25 VITALS — BP 118/76 | HR 63 | Temp 97.6°F | Wt 204.2 lb

## 2016-08-25 DIAGNOSIS — J329 Chronic sinusitis, unspecified: Secondary | ICD-10-CM | POA: Diagnosis not present

## 2016-08-25 DIAGNOSIS — B9689 Other specified bacterial agents as the cause of diseases classified elsewhere: Secondary | ICD-10-CM

## 2016-08-25 MED ORDER — AMOXICILLIN-POT CLAVULANATE 875-125 MG PO TABS: 1.0000 | ORAL_TABLET | Freq: Two times a day (BID) | ORAL | 0 refills | Status: DC

## 2016-08-25 NOTE — Patient Instructions (Signed)
Sinsusitis Bacterial based on: Symptoms >10 days- in fact nearly a month now  Treatment: -considered steroid: we opted out- usually does well with antibiotic -other symptomatic care with aspirin ok -Antibiotic indicated: yes, augmentin for 7 days  Finally, we reviewed reasons to return to care including if symptoms worsen or persist or new concerns arise (particularly fever or shortness of breath)  Flu shot when feeling better  Schedule physical for mid to late January- need to update thyroid and cholesterol  Meds ordered this encounter  Medications  . amoxicillin-clavulanate (AUGMENTIN) 875-125 MG tablet    Sig: Take 1 tablet by mouth 2 (two) times daily.    Dispense:  14 tablet    Refill:  0

## 2016-08-25 NOTE — Progress Notes (Signed)
PCP: Christopher ConchStephen Laszlo Ellerby, MD  Subjective:  Christopher Blair is a 55 y.o. year old very pleasant male patient who presents with sinusitis symptoms including nasal congestion, sinus tenderness, headaches at times -other symptoms include: yellow/green discharge sometimes blood twinged. -day of illness: at least 1 month of symptomsFirst four days of illness had fever but none since that time.   -Symptoms show no change -previous treatments: aspirin, rest, hydration -several sick contacts at work  ROS-denies fever, SOB, NVD, tooth pain  Pertinent Past Medical History-  Patient Active Problem List   Diagnosis Date Noted  . Hyperlipidemia 07/29/2015    Priority: Medium  . Family history of MI (myocardial infarction) 10/19/2014    Priority: Medium  . Hypothyroidism 01/05/2014    Priority: Medium  . Myasthenia gravis (HCC) 02/08/2010    Priority: Low    Medications- reviewed  Current Outpatient Prescriptions  Medication Sig Dispense Refill  . aspirin 81 MG tablet Take 81 mg by mouth daily. Reported on 12/27/2015    . ibuprofen (ADVIL,MOTRIN) 200 MG tablet Take 400 mg by mouth every 6 (six) hours as needed. Reported on 12/27/2015    . levothyroxine (SYNTHROID, LEVOTHROID) 100 MCG tablet TAKE ONE TABLET BY MOUTH ONCE DAILY 30 tablet 5   No current facility-administered medications for this visit.     Objective: BP 118/76 (BP Location: Left Arm, Patient Position: Sitting, Cuff Size: Normal)   Pulse 63   Temp 97.6 F (36.4 C) (Oral)   Wt 204 lb 3.2 oz (92.6 kg)   SpO2 97%   BMI 31.98 kg/m  Gen: NAD, resting comfortably HEENT: Turbinates erythematous with yellow drainage- friable mucosa, TM normal, pharynx mildly erythematous with no tonsilar exudate or edema, maxillary sinus tenderness CV: RRR no murmurs rubs or gallops Lungs: CTAB no crackles, wheeze, rhonchi Skin: warm, dry, no rash  Assessment/Plan:  Sinsusitis Bacterial based on: Symptoms >10 days- in fact nearly a month  now  Treatment: -considered steroid: we opted out- usually does well with antibiotic -other symptomatic care with aspirin ok -Antibiotic indicated: yes, augmentin for 7 days  Finally, we reviewed reasons to return to care including if symptoms worsen or persist or new concerns arise (particularly fever or shortness of breath)  Flu shot when feeling better  Schedule physical for mid to late January- need to update thyroid and cholesterol  Meds ordered this encounter  Medications  . amoxicillin-clavulanate (AUGMENTIN) 875-125 MG tablet    Sig: Take 1 tablet by mouth 2 (two) times daily.    Dispense:  14 tablet    Refill:  0    Christopher ConchStephen Syniah Berne, MD

## 2016-08-25 NOTE — Telephone Encounter (Signed)
Left voicemail that other choices at [facility] were not ideal for sinus infection. Would probably take augmentin. Would have wanted to talk through other options but he was not available

## 2016-08-25 NOTE — Progress Notes (Signed)
Pre visit review using our clinic review tool, if applicable. No additional management support is needed unless otherwise documented below in the visit note. 

## 2016-08-25 NOTE — Telephone Encounter (Signed)
Pt states the  amoxicillin-clavulanate (AUGMENTIN) 875-125 MG tablet   was $38.  Pt states he needs something on his $4 list. Advised pt we would not know that, and he states the pharmacy was rude, and he does not understand why we would not know .  Advised pt I would send a message to the doctor, but he really should check with his pharmacy or insurance.  We got disconnected, I tried to call the pt back, but no answer. Left voice mail   [facility]

## 2016-08-28 ENCOUNTER — Telehealth: Payer: Self-pay | Admitting: Family Medicine

## 2016-08-28 NOTE — Telephone Encounter (Signed)
Error/ltd ° °

## 2016-08-28 NOTE — Telephone Encounter (Signed)
Pt calling to check the status of the Rx will call back after calling to check to see what pharmacy is less expensive.

## 2016-09-08 ENCOUNTER — Telehealth: Payer: Self-pay | Admitting: Family Medicine

## 2016-09-08 NOTE — Telephone Encounter (Signed)
° ° ° ° °  Pt call to say he is still having congestion,cough and is asking if something else can be called in for him     [facility]   [address]

## 2016-09-11 MED ORDER — LEVOFLOXACIN 500 MG PO TABS
500.0000 mg | ORAL_TABLET | Freq: Every day | ORAL | 0 refills | Status: DC
Start: 2016-09-11 — End: 2016-11-08

## 2016-09-11 NOTE — Telephone Encounter (Signed)
Levaquin called in. If he fails this- will need to refer to ENT

## 2016-09-11 NOTE — Telephone Encounter (Signed)
Called and left a voicemail message asking patient to return call

## 2016-09-11 NOTE — Telephone Encounter (Signed)
Spoke with patient and made him aware that medication was sent  into Pharmacy

## 2016-10-16 ENCOUNTER — Other Ambulatory Visit: Payer: Self-pay | Admitting: Family Medicine

## 2016-10-16 ENCOUNTER — Other Ambulatory Visit (INDEPENDENT_AMBULATORY_CARE_PROVIDER_SITE_OTHER): Payer: No Typology Code available for payment source

## 2016-10-16 DIAGNOSIS — E039 Hypothyroidism, unspecified: Secondary | ICD-10-CM

## 2016-10-16 DIAGNOSIS — Z20828 Contact with and (suspected) exposure to other viral communicable diseases: Secondary | ICD-10-CM

## 2016-10-16 DIAGNOSIS — E785 Hyperlipidemia, unspecified: Secondary | ICD-10-CM | POA: Diagnosis not present

## 2016-10-16 LAB — LIPID PANEL
CHOLESTEROL: 262 mg/dL — AB (ref 0–200)
HDL: 38.4 mg/dL — ABNORMAL LOW (ref >39.00)
LDL CALC: 186 mg/dL — AB (ref 0–99)
NonHDL: 223.49
TRIGLYCERIDES: 187 mg/dL — AB (ref 0.0–149.0)
Total CHOL/HDL Ratio: 7
VLDL: 37.4 mg/dL (ref 0.0–40.0)

## 2016-10-16 LAB — TSH: TSH: 3.64 u[IU]/mL (ref 0.35–4.50)

## 2016-10-17 LAB — HIV ANTIBODY (ROUTINE TESTING W REFLEX): HIV: NONREACTIVE

## 2016-10-17 LAB — HEPATITIS C ANTIBODY: HCV AB: NEGATIVE

## 2016-11-08 ENCOUNTER — Encounter: Payer: Self-pay | Admitting: Family Medicine

## 2016-11-08 ENCOUNTER — Ambulatory Visit (INDEPENDENT_AMBULATORY_CARE_PROVIDER_SITE_OTHER): Payer: No Typology Code available for payment source | Admitting: Family Medicine

## 2016-11-08 VITALS — BP 132/88 | HR 64 | Temp 97.8°F | Ht 67.25 in | Wt 206.4 lb

## 2016-11-08 DIAGNOSIS — E785 Hyperlipidemia, unspecified: Secondary | ICD-10-CM | POA: Diagnosis not present

## 2016-11-08 DIAGNOSIS — Z0000 Encounter for general adult medical examination without abnormal findings: Secondary | ICD-10-CM

## 2016-11-08 DIAGNOSIS — Z1211 Encounter for screening for malignant neoplasm of colon: Secondary | ICD-10-CM | POA: Diagnosis not present

## 2016-11-08 DIAGNOSIS — Z23 Encounter for immunization: Secondary | ICD-10-CM

## 2016-11-08 LAB — CBC
HEMATOCRIT: 47.6 % (ref 39.0–52.0)
Hemoglobin: 16.7 g/dL (ref 13.0–17.0)
MCHC: 35.2 g/dL (ref 30.0–36.0)
MCV: 90.4 fl (ref 78.0–100.0)
Platelets: 288 10*3/uL (ref 150.0–400.0)
RBC: 5.26 Mil/uL (ref 4.22–5.81)
RDW: 12.9 % (ref 11.5–15.5)
WBC: 7 10*3/uL (ref 4.0–10.5)

## 2016-11-08 LAB — COMPREHENSIVE METABOLIC PANEL
ALBUMIN: 4.6 g/dL (ref 3.5–5.2)
ALK PHOS: 83 U/L (ref 39–117)
ALT: 22 U/L (ref 0–53)
AST: 21 U/L (ref 0–37)
BUN: 12 mg/dL (ref 6–23)
CHLORIDE: 103 meq/L (ref 96–112)
CO2: 29 mEq/L (ref 19–32)
CREATININE: 0.98 mg/dL (ref 0.40–1.50)
Calcium: 9.5 mg/dL (ref 8.4–10.5)
GFR: 84.08 mL/min (ref >60.00)
GLUCOSE: 93 mg/dL (ref 70–99)
POTASSIUM: 4.2 meq/L (ref 3.5–5.1)
SODIUM: 137 meq/L (ref 135–145)
TOTAL PROTEIN: 7.3 g/dL (ref 6.0–8.3)
Total Bilirubin: 0.7 mg/dL (ref 0.2–1.2)

## 2016-11-08 LAB — PSA: PSA: 0.89 ng/mL (ref 0.10–4.00)

## 2016-11-08 NOTE — Patient Instructions (Addendum)
Labs before you go- check PSA since that was not done with your labs. Will also do CBC and CMP (blood counts, kidney, liver)  Hopefully you can get a job with fewer hours- up the exercise, improve the diet, cut the coke. Would love to se eyou down 20 lbs in a year- without some significant changes will likely have to start cholesterol medicine  Flu shot before you leave  We will call you within a week or two about your referral to GI for colonoscopy. If you do not hear within 3 weeks, give us a call.

## 2016-11-08 NOTE — Progress Notes (Signed)
Phone: [phone]  Subjective:  Patient presents today for their annual physical. Chief complaint-noted.   See problem oriented charting- ROS- full  review of systems was completed and negative except for nocturia. No chest pain or shortness of breath. No headache or blurry vision.   The following were reviewed and entered/updated in epic: Past Medical History:  Diagnosis Date  . Hypothyroidism   . Myasthenia gravis 1994   in remission since 1996   Patient Active Problem List   Diagnosis Date Noted  . Hyperlipidemia 07/29/2015    Priority: Medium  . Family history of MI (myocardial infarction) 10/19/2014    Priority: Medium  . Hypothyroidism 01/05/2014    Priority: Medium  . Myasthenia gravis (HCC) 02/08/2010    Priority: Low   Past Surgical History:  Procedure Laterality Date  . TOTAL THYMECTOMY  1994    Family History  Problem Relation Age of Onset  . Cancer Mother 1    breast ca  . Heart disease Father     AMI-61, nonsmoker, died 72  . Migraines Son     and tics  . Anxiety disorder Daughter     Medications- reviewed and updated Current Outpatient Prescriptions  Medication Sig Dispense Refill  . aspirin 81 MG tablet Take 81 mg by mouth daily. Reported on 12/27/2015    . ibuprofen (ADVIL,MOTRIN) 200 MG tablet Take 400 mg by mouth every 6 (six) hours as needed. Reported on 12/27/2015    . levothyroxine (SYNTHROID, LEVOTHROID) 100 MCG tablet TAKE ONE TABLET BY MOUTH ONCE DAILY 30 tablet 5   No current facility-administered medications for this visit.     Allergies-reviewed and updated Allergies  Allergen Reactions  . Mucinex [Guaifenesin Er] Other (See Comments)    vomiting    Social History   Social History  . Marital status: Married    Spouse name: N/A  . Number of children: N/A  . Years of education: N/A   Social History Main Topics  . Smoking status: Never Smoker  . Smokeless tobacco: Never Used  . Alcohol use Yes     Comment: occasional    . Drug use: No  . Sexual activity: Not Asked   Other Topics Concern  . None   Social History Narrative   Married. 3 children. No grandkids. Oldest daughter getting married in June 2017.       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       Hobbies: essentially no hobbies as works frequently   Goes to [employer]     Objective: BP 132/88 (BP Location: Left Arm, Patient Position: Sitting, Cuff Size: Large)   Pulse 64   Temp 97.8 F (36.6 C) (Oral)   Ht 5' 7.25" (1.708 m)   Wt 206 lb 6.4 oz (93.6 kg)   SpO2 94%   BMI 32.09 kg/m  Gen: NAD, resting comfortably HEENT: Mucous membranes are moist. Oropharynx normal Neck: no thyromegaly CV: RRR no murmurs rubs or gallops Lungs: CTAB no crackles, wheeze, rhonchi Abdomen: soft/nontender/nondistended/normal bowel sounds. No rebound or guarding.  Ext: no edema Skin: warm, dry Neuro: grossly normal, moves all extremities, PERRLA Rectal: normal tone, normal sized prostate, no masses or tenderness  Assessment/Plan:  56 y.o. male presenting for annual physical.  Health Maintenance counseling: 1. Anticipatory guidance: Patient counseled regarding regular dental exams -does not go- brushes/floss, eye exams -sees in 2 weeks, wearing seatbelts.  2. Risk factor reduction:  Advised  patient of need for regular exercise and diet rich and fruits and vegetables to reduce risk of heart attack and stroke. 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 Diet-weight largely stable. Would love to see this drop down- hoping can eat better with less stressful job potentially- would also help cholesterol.  Wt Readings from Last 3 Encounters:  11/08/16 206 lb 6.4 oz (93.6 kg)  08/25/16 204 lb 3.2 oz (92.6 kg)  12/27/15 206 lb (93.4 kg)   3. Immunizations/screenings/ancillary studies Immunization History  Administered Date(s) Administered  . Influenza,inj,Quad PF,36+ Mos  07/29/2015, 11/08/2016  . Td 06/01/2009   Health Maintenance Due  Topic Date Due  . COLONOSCOPY = refer today 10/23/2010  . INFLUENZA VACCINE - give today 05/09/2016   4. Prostate cancer screening- update psa, no BPH on exam. Pees up to 3x a night but drinks even late into evening  Lab Results  Component Value Date   PSA 0.67 10/27/2015   PSA 0.95 10/19/2014   PSA 0.94 03/28/2013   5. Colon cancer screening - refer today 6. Skin cancer screening- uses sunscreen, denies lesions concerned about  Status of chronic or acute concerns  Hypothyroidism- on levothyroxine well controlled Lab Results  Component Value Date   TSH 3.64 10/16/2016   Hyperlipidemia 10.3% 10 year risk 2017. Wants to focus on diet/weight loss/exercise on one hand but not much time in his schedule to achieve this. Agreed to 12% limit to start statin- repeat 1 year  1 year CPE  Orders Placed This Encounter  Procedures  . Flu Vaccine QUAD 36+ mos IM  . CBC    [place]  . Comprehensive metabolic panel    [place]    Order Specific Question:   Has the patient fasted?    Answer:   No  . PSA  . Ambulatory referral to Gastroenterology    Referral Priority:   Routine    Referral Type:   Consultation    Referral Reason:   Specialty Services Required    Number of Visits Requested:   1   Return precautions advised.   Tana ConchStephen Ivey Nembhard, MD

## 2016-11-08 NOTE — Progress Notes (Signed)
Pre visit review using our clinic review tool, if applicable. No additional management support is needed unless otherwise documented below in the visit note. 

## 2016-11-08 NOTE — Assessment & Plan Note (Signed)
10.3% 10 year risk 2017. Wants to focus on diet/weight loss/exercise on one hand but not much time in his schedule to achieve this. Agreed to 12% limit to start statin- repeat 1 year

## 2016-11-28 ENCOUNTER — Other Ambulatory Visit: Payer: Self-pay | Admitting: Family Medicine

## 2016-12-08 ENCOUNTER — Encounter: Payer: Self-pay | Admitting: Family Medicine

## 2017-01-09 LAB — HM COLONOSCOPY

## 2017-06-01 ENCOUNTER — Other Ambulatory Visit: Payer: Self-pay | Admitting: Family Medicine

## 2017-06-08 ENCOUNTER — Ambulatory Visit: Payer: No Typology Code available for payment source | Admitting: Family Medicine

## 2017-10-22 ENCOUNTER — Telehealth: Payer: Self-pay | Admitting: Family Medicine

## 2017-10-22 NOTE — Telephone Encounter (Signed)
Please contact patient due to him just having a physical however, he is requesting another.   Copied from CRM [phone]. Topic: Inquiry >> Oct 22, 2017  4:39 PM Christopher Blair, [name], NT wrote: Reason for CRM: patient is following Dr. 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 Patient last physical is 11/08/16 but says he needs it asap. Please advise and contact patient back with appt. Thank you

## 2017-10-24 NOTE — Telephone Encounter (Signed)
Dr Therapist, nutritionalhunter, is it ok to use you am appt on Friday for this pt? Pt cannot work until he gets a cpe down.  Thank you!!

## 2017-10-25 NOTE — Telephone Encounter (Signed)
Called and spoke to patient who states he had a TB skin test on 01/2017. He is calling to see if he can get a copy of the results. 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 He is scheduled with Dr. Durene CalHUnter on 10/26/17

## 2017-10-26 ENCOUNTER — Ambulatory Visit: Payer: Self-pay | Admitting: Family Medicine

## 2017-11-22 ENCOUNTER — Ambulatory Visit: Payer: BLUE CROSS/BLUE SHIELD | Admitting: Physician Assistant

## 2017-11-22 ENCOUNTER — Encounter (HOSPITAL_COMMUNITY): Payer: Self-pay | Admitting: Emergency Medicine

## 2017-11-22 ENCOUNTER — Inpatient Hospital Stay (HOSPITAL_COMMUNITY)
Admission: EM | Admit: 2017-11-22 | Discharge: 2017-11-27 | DRG: 853 | Disposition: A | Discharge status: Home / Self Care | Payer: BLUE CROSS/BLUE SHIELD | Attending: Internal Medicine | Admitting: Internal Medicine

## 2017-11-22 ENCOUNTER — Encounter: Payer: Self-pay | Admitting: Physician Assistant

## 2017-11-22 ENCOUNTER — Emergency Department (HOSPITAL_COMMUNITY): Payer: BLUE CROSS/BLUE SHIELD

## 2017-11-22 ENCOUNTER — Ambulatory Visit: Payer: Self-pay | Admitting: *Deleted

## 2017-11-22 ENCOUNTER — Other Ambulatory Visit: Payer: Self-pay

## 2017-11-22 VITALS — BP 128/86 | HR 123 | Temp 97.7°F | Ht 67.25 in | Wt 197.2 lb

## 2017-11-22 DIAGNOSIS — K8020 Calculus of gallbladder without cholecystitis without obstruction: Secondary | ICD-10-CM | POA: Diagnosis not present

## 2017-11-22 DIAGNOSIS — E876 Hypokalemia: Secondary | ICD-10-CM | POA: Diagnosis not present

## 2017-11-22 DIAGNOSIS — R1013 Epigastric pain: Secondary | ICD-10-CM | POA: Diagnosis not present

## 2017-11-22 DIAGNOSIS — Z7982 Long term (current) use of aspirin: Secondary | ICD-10-CM | POA: Diagnosis not present

## 2017-11-22 DIAGNOSIS — R109 Unspecified abdominal pain: Secondary | ICD-10-CM

## 2017-11-22 DIAGNOSIS — G7000 Myasthenia gravis without (acute) exacerbation: Secondary | ICD-10-CM | POA: Diagnosis not present

## 2017-11-22 DIAGNOSIS — E860 Dehydration: Secondary | ICD-10-CM | POA: Diagnosis not present

## 2017-11-22 DIAGNOSIS — K8070 Calculus of gallbladder and bile duct without cholecystitis without obstruction: Secondary | ICD-10-CM | POA: Diagnosis not present

## 2017-11-22 DIAGNOSIS — K838 Other specified diseases of biliary tract: Secondary | ICD-10-CM | POA: Diagnosis not present

## 2017-11-22 DIAGNOSIS — K5900 Constipation, unspecified: Secondary | ICD-10-CM | POA: Diagnosis not present

## 2017-11-22 DIAGNOSIS — K851 Biliary acute pancreatitis without necrosis or infection: Secondary | ICD-10-CM | POA: Diagnosis not present

## 2017-11-22 DIAGNOSIS — K8010 Calculus of gallbladder with chronic cholecystitis without obstruction: Secondary | ICD-10-CM | POA: Diagnosis not present

## 2017-11-22 DIAGNOSIS — K8050 Calculus of bile duct without cholangitis or cholecystitis without obstruction: Secondary | ICD-10-CM

## 2017-11-22 DIAGNOSIS — E039 Hypothyroidism, unspecified: Secondary | ICD-10-CM | POA: Diagnosis not present

## 2017-11-22 DIAGNOSIS — R932 Abnormal findings on diagnostic imaging of liver and biliary tract: Secondary | ICD-10-CM | POA: Diagnosis not present

## 2017-11-22 DIAGNOSIS — R7989 Other specified abnormal findings of blood chemistry: Secondary | ICD-10-CM

## 2017-11-22 DIAGNOSIS — R945 Abnormal results of liver function studies: Secondary | ICD-10-CM | POA: Diagnosis not present

## 2017-11-22 DIAGNOSIS — E785 Hyperlipidemia, unspecified: Secondary | ICD-10-CM | POA: Diagnosis not present

## 2017-11-22 DIAGNOSIS — Z79899 Other long term (current) drug therapy: Secondary | ICD-10-CM | POA: Diagnosis not present

## 2017-11-22 DIAGNOSIS — Z419 Encounter for procedure for purposes other than remedying health state, unspecified: Secondary | ICD-10-CM

## 2017-11-22 DIAGNOSIS — R1084 Generalized abdominal pain: Secondary | ICD-10-CM | POA: Diagnosis not present

## 2017-11-22 DIAGNOSIS — R110 Nausea: Secondary | ICD-10-CM | POA: Diagnosis not present

## 2017-11-22 DIAGNOSIS — Z411 Encounter for cosmetic surgery: Secondary | ICD-10-CM | POA: Diagnosis not present

## 2017-11-22 DIAGNOSIS — A419 Sepsis, unspecified organism: Principal | ICD-10-CM | POA: Diagnosis present

## 2017-11-22 DIAGNOSIS — R1030 Lower abdominal pain, unspecified: Secondary | ICD-10-CM | POA: Diagnosis not present

## 2017-11-22 LAB — COMPREHENSIVE METABOLIC PANEL
ALBUMIN: 4 g/dL (ref 3.5–5.0)
ALT: 677 U/L — ABNORMAL HIGH (ref 17–63)
ANION GAP: 12 (ref 5–15)
AST: 259 U/L — ABNORMAL HIGH (ref 15–41)
Alkaline Phosphatase: 471 U/L — ABNORMAL HIGH (ref 38–126)
BUN: 18 mg/dL (ref 6–20)
CO2: 21 mmol/L — AB (ref 22–32)
Calcium: 8.9 mg/dL (ref 8.9–10.3)
Chloride: 103 mmol/L (ref 101–111)
Creatinine, Ser: 0.87 mg/dL (ref 0.61–1.24)
GFR calc Af Amer: >60 mL/min (ref >60)
GFR calc non Af Amer: >60 mL/min (ref >60)
GLUCOSE: 131 mg/dL — AB (ref 65–99)
POTASSIUM: 3.6 mmol/L (ref 3.5–5.1)
SODIUM: 136 mmol/L (ref 135–145)
Total Bilirubin: 8.8 mg/dL — ABNORMAL HIGH (ref 0.3–1.2)
Total Protein: 7.8 g/dL (ref 6.5–8.1)

## 2017-11-22 LAB — CBC
HEMATOCRIT: 49 % (ref 39.0–52.0)
HEMOGLOBIN: 17.2 g/dL — AB (ref 13.0–17.0)
MCH: 31.9 pg (ref 26.0–34.0)
MCHC: 35.1 g/dL (ref 30.0–36.0)
MCV: 90.9 fL (ref 78.0–100.0)
Platelets: 288 10*3/uL (ref 150–400)
RBC: 5.39 MIL/uL (ref 4.22–5.81)
RDW: 13.8 % (ref 11.5–15.5)
WBC: 26.9 10*3/uL — ABNORMAL HIGH (ref 4.0–10.5)

## 2017-11-22 LAB — PROTIME-INR
INR: 1.25
INR: 1.25
Prothrombin Time: 15.6 seconds — ABNORMAL HIGH (ref 11.4–15.2)
Prothrombin Time: 15.6 seconds — ABNORMAL HIGH (ref 11.4–15.2)

## 2017-11-22 LAB — I-STAT CG4 LACTIC ACID, ED: Lactic Acid, Venous: 1.53 mmol/L (ref 0.5–1.9)

## 2017-11-22 LAB — TYPE AND SCREEN
ABO/RH(D): B POS
Antibody Screen: NEGATIVE

## 2017-11-22 LAB — URINALYSIS, ROUTINE W REFLEX MICROSCOPIC
GLUCOSE, UA: NEGATIVE mg/dL
KETONES UR: 5 mg/dL — AB
Leukocytes, UA: NEGATIVE
Nitrite: NEGATIVE
PROTEIN: 30 mg/dL — AB
Specific Gravity, Urine: 1.025 (ref 1.005–1.030)
pH: 5 (ref 5.0–8.0)

## 2017-11-22 LAB — ABO/RH: ABO/RH(D): B POS

## 2017-11-22 LAB — PROCALCITONIN: Procalcitonin: 2.64 ng/mL

## 2017-11-22 LAB — LIPASE, BLOOD: Lipase: 1167 U/L — ABNORMAL HIGH (ref 11–51)

## 2017-11-22 LAB — APTT: aPTT: 30 seconds (ref 24–36)

## 2017-11-22 MED ORDER — ONDANSETRON HCL 4 MG/2ML IJ SOLN
4.0000 mg | Freq: Once | INTRAMUSCULAR | Status: AC
  Administered 2017-11-22: 4 mg via INTRAVENOUS
  Filled 2017-11-22: qty 2

## 2017-11-22 MED ORDER — PIPERACILLIN-TAZOBACTAM 3.375 G IVPB
3.3750 g | Freq: Three times a day (TID) | INTRAVENOUS | Status: DC
  Administered 2017-11-23 – 2017-11-27 (×14): 3.375 g via INTRAVENOUS
  Filled 2017-11-22 (×14): qty 50

## 2017-11-22 MED ORDER — SODIUM CHLORIDE 0.9 % IV BOLUS (SEPSIS)
1000.0000 mL | Freq: Once | INTRAVENOUS | Status: AC
Start: 2017-11-22 — End: 2017-11-22
  Administered 2017-11-22: 1000 mL via INTRAVENOUS

## 2017-11-22 MED ORDER — MORPHINE SULFATE (PF) 4 MG/ML IV SOLN
4.0000 mg | Freq: Once | INTRAVENOUS | Status: AC
  Administered 2017-11-22: 4 mg via INTRAVENOUS
  Filled 2017-11-22: qty 1

## 2017-11-22 MED ORDER — ONDANSETRON HCL 4 MG PO TABS: 4.0000 mg | ORAL_TABLET | Freq: Four times a day (QID) | ORAL | Status: DC | PRN

## 2017-11-22 MED ORDER — PIPERACILLIN-TAZOBACTAM 3.375 G IVPB 30 MIN
3.3750 g | Freq: Once | INTRAVENOUS | Status: AC
Start: 2017-11-22 — End: 2017-11-22
  Administered 2017-11-22: 3.375 g via INTRAVENOUS
  Filled 2017-11-22: qty 50

## 2017-11-22 MED ORDER — GADOBENATE DIMEGLUMINE 529 MG/ML IV SOLN
20.0000 mL | Freq: Once | INTRAVENOUS | Status: AC | PRN
  Administered 2017-11-22: 18 mL via INTRAVENOUS

## 2017-11-22 MED ORDER — SODIUM CHLORIDE 0.9 % IV SOLN
INTRAVENOUS | Status: DC
  Administered 2017-11-22 – 2017-11-23 (×5): via INTRAVENOUS

## 2017-11-22 MED ORDER — MORPHINE SULFATE (PF) 4 MG/ML IV SOLN
2.0000 mg | INTRAVENOUS | Status: DC | PRN
  Administered 2017-11-23 – 2017-11-24 (×7): 4 mg via INTRAVENOUS
  Filled 2017-11-22 (×7): qty 1

## 2017-11-22 MED ORDER — ONDANSETRON HCL 4 MG/2ML IJ SOLN
4.0000 mg | Freq: Four times a day (QID) | INTRAMUSCULAR | Status: DC | PRN
  Administered 2017-11-23 – 2017-11-25 (×3): 4 mg via INTRAVENOUS
  Filled 2017-11-22 (×4): qty 2

## 2017-11-22 MED ORDER — LEVOTHYROXINE SODIUM 100 MCG IV SOLR
50.0000 ug | Freq: Every day | INTRAVENOUS | Status: DC
  Administered 2017-11-22 – 2017-11-27 (×6): 50 ug via INTRAVENOUS
  Filled 2017-11-22 (×7): qty 5

## 2017-11-22 MED ORDER — ENOXAPARIN SODIUM 40 MG/0.4ML ~~LOC~~ SOLN
40.0000 mg | Freq: Every day | SUBCUTANEOUS | Status: DC
  Administered 2017-11-22 – 2017-11-26 (×4): 40 mg via SUBCUTANEOUS
  Filled 2017-11-22 (×4): qty 0.4

## 2017-11-22 NOTE — H&P (Addendum)
History and Physical    Christopher DISSINGER Blair:[id number] DOB: [birth year] DOA: 11/22/2017  Referring MD/NP/PA: Dr. Charm Barges   PCP: Shelva Majestic, MD   Patient coming from: home  Chief Complaint: Abdominal pain  History of present illness: Patient is 57 year old male with known history of myasthenia gravis that has been stable and in remission for 20 years, presented to [facility] emergency department with main concern of 3 days duration of diffuse abdominal pain, sharp and intermittent, stabbing like at times, 10/10 in severity, radiating to the back, associated with nausea and nonbloody vomiting, unable to keep anything down. Patient also reports associated dyspnea, pain with deep breaths. Patient denies fevers or chills, no recent sick contacts or exposures, no similar events in the past. Patient denies chest pain, no specific urinary concerns.  In emergency department, patient noted to be in mild distress due to pain, vital signs notable for temperature 98.7, heart rate up to 123, blood pressure 127/98. Blood work notable for WBC 26.9, hemoglobin 17.2, ALT 259, AST 671 lipase 1100. Concern for gallstone pancreatitis. TR[facility] asked to admit for further evaluation. ER doctor consulted with GI on call, recommendation is for stat MRCP, hold off on antibiotics.   Review of Systems:  Constitutional: Negative for fever, chills HENT: Negative for ear pain, nosebleeds, congestion, facial swelling, rhinorrhea, neck pain, neck stiffness and ear discharge.   Eyes: Negative for pain, discharge, redness, itching and visual disturbance.  Respiratory: Negative for cough, choking, chest tightness, shortness of breath, wheezing and stridor.   Cardiovascular: Negative for chest pain, palpitations and leg swelling.  Gastrointestinal: Negative for abdominal distention.  Genitourinary: Negative for dysuria, urgency, frequency, hematuria, flank pain, decreased urine volume, difficulty urinating and dyspareunia.    Musculoskeletal: Negative for back pain, joint swelling, arthralgias and gait problem.  Neurological: Negative for dizziness, tremors, seizures, syncope, facial asymmetry, speech difficulty, weakness, light-headedness, numbness and headaches.  Hematological: Negative for adenopathy. Does not bruise/bleed easily.  Psychiatric/Behavioral: Negative for hallucinations, behavioral problems, confusion, dysphoric mood, decreased concentration and agitation.   Past Medical History:  Diagnosis Date  . Hypothyroidism   . Myasthenia gravis 1994   in remission since 1996    Past Surgical History:  Procedure Laterality Date  . TOTAL THYMECTOMY  1994   Social history:  reports that  has never smoked. he has never used smokeless tobacco. He reports that he drinks alcohol. He reports that he does not use drugs.  Allergies  Allergen Reactions  . Mucinex [Guaifenesin Er] Other (See Comments)    vomiting    Family History  Problem Relation Age of Onset  . Cancer Mother 80       breast ca  . Heart disease Father        AMI-61, nonsmoker, died 32  . Migraines Son        and tics  . Anxiety disorder Daughter     Prior to Admission medications   Medication Sig Start Date End Date Taking? Authorizing Provider  aspirin 81 MG tablet Take 81 mg by mouth daily. Reported on 12/27/2015   Yes [provider]  ibuprofen (ADVIL,MOTRIN) 200 MG tablet Take 400 mg by mouth every 6 (six) hours as needed. Reported on 12/27/2015   Yes [provider]  levothyroxine (SYNTHROID, LEVOTHROID) 100 MCG tablet TAKE 1 TABLET BY MOUTH ONCE DAILY 06/01/17  Yes Shelva Majestic, MD    Physical Exam: Vitals:   11/22/17 1311 11/22/17 1604  BP: (!) 127/98 132/87  Pulse: Marland Kitchen)  121 (!) 113  Resp: 18 18  Temp: 98.7 F (37.1 C)   TempSrc: Oral   SpO2: 96% 96%    Constitutional: NAD, calm, comfortable Vitals:   11/22/17 1311 11/22/17 1604  BP: (!) 127/98 132/87  Pulse: (!) 121 (!) 113  Resp: 18 18   Temp: 98.7 F (37.1 C)   TempSrc: Oral   SpO2: 96% 96%   Eyes: PERRL, lids and conjunctivae normal ENMT: Mucous membranes are dry. Posterior pharynx clear of any exudate or lesions.Normal dentition.  Neck: normal, supple, no masses, no thyromegaly Respiratory: clear to auscultation bilaterally, no wheezing, no crackles. Normal respiratory effort. No accessory muscle use.  Cardiovascular:  tachycardic, no murmurs  Abdomen:  slightly distended, diffuse tenderness to palpation, no guarding or rebound tenderness  Musculoskeletal: no clubbing / cyanosis. No joint deformity upper and lower extremities. Good ROM, no contractures. Normal muscle tone.  Skin: no rashes, lesions, ulcers. No induration Neurologic: CN 2-12 grossly intact. Sensation intact, DTR normal. Strength 5/5 in all 4.  Psychiatric: Normal judgment and insight. Alert and oriented x 3. Normal mood.   Labs on Admission: I have personally reviewed following labs and imaging studies  CBC: Recent Labs  Lab 11/22/17 1329  WBC 26.9*  HGB 17.2*  HCT 49.0  MCV 90.9  PLT 288   Basic Metabolic Panel: Recent Labs  Lab 11/22/17 1329  NA 136  K 3.6  CL 103  CO2 21*  GLUCOSE 131*  BUN 18  CREATININE 0.87  CALCIUM 8.9   Liver Function Tests: Recent Labs  Lab 11/22/17 1329  AST 259*  ALT 677*  ALKPHOS 471*  BILITOT 8.8*  PROT 7.8  ALBUMIN 4.0   Recent Labs  Lab 11/22/17 1329  LIPASE 1,167*   Urine analysis:    Component Value Date/Time   COLORURINE AMBER (A) 11/22/2017 1354   APPEARANCEUR HAZY (A) 11/22/2017 1354   LABSPEC 1.025 11/22/2017 1354   PHURINE 5.0 11/22/2017 1354   GLUCOSEU NEGATIVE 11/22/2017 1354   GLUCOSEU NEGATIVE 05/27/2009 0727   HGBUR MODERATE (A) 11/22/2017 1354   BILIRUBINUR MODERATE (A) 11/22/2017 1354   BILIRUBINUR n 10/27/2015 0931   KETONESUR 5 (A) 11/22/2017 1354   PROTEINUR 30 (A) 11/22/2017 1354   UROBILINOGEN 0.2 10/27/2015 0931   UROBILINOGEN 0.2 05/27/2009 0727   NITRITE  NEGATIVE 11/22/2017 1354   LEUKOCYTESUR NEGATIVE 11/22/2017 1354    Radiological Exams on Admission: No results found.  EKG: sinus tachy   Assessment/Plan Active Problems: Sepsis, Acute diffuse abdominal pain, transaminitis, pancreatitis - Appears to be gallstone pancreatitis - please note that pt meets criteria for sepsis, order set in place  - MRCP to be done stat - GI team already consulted - Will obtain blood cultures, lactic acid, pro calcitonin - start empiric ABX Zosyn  - Provide analgesia and antiemetics as needed - Keep nothing by mouth for now - CMET and lipase in AM - CBC in AM  Elevated hemoglobin - From dehydration - IV fluids ordered - CBC in the morning  Hypothyroidism - hold oral synthroid for now - check TSH - place on IV synthroid   DVT prophylaxis: Lovenox SQ Code Status: Full  Family Communication: Pt and wife updated at bedside Disposition Plan: to be determined  Consults called: GI  Admission status: Inpatient    Debbora PrestoIskra Magick-Osama Coleson MD [facility] Pager [phone]- [phone]  If 7PM-7AM, please contact night-coverage [email or web address] Password [email or web address]  11/22/2017, 6:18 PM

## 2017-11-22 NOTE — ED Triage Notes (Signed)
Per [facility] pt from PCP for abd pain with tenderness with palpation.

## 2017-11-22 NOTE — ED Notes (Signed)
Bed: WA24 Expected date:  Expected time:  Means of arrival:  Comments: Hold for [employer]

## 2017-11-22 NOTE — Telephone Encounter (Signed)
   Reason for Disposition . [1] MODERATE pain (e.g., interferes with normal activities) AND [2] pain comes and goes (cramps) AND [3] present > 24 hours  (Exception: pain with Vomiting or Diarrhea - see that Guideline)  Answer Assessment - Initial Assessment Questions 1. LOCATION: "Where does it hurt?"      Just above umbilicus  and out to the both sides 2. RADIATION: "Does the pain shoot anywhere else?" (e.g., chest, back)     Towards back 3. ONSET: "When did the pain begin?" (Minutes, hours or days ago)     Today is day 3 4. SUDDEN: "Gradual or sudden onset?"    Gradual during the day. 5. PATTERN "Does the pain come and go, or is it constant?"    - If constant: "Is it getting better, staying the same, or worsening?"      (Note: Constant means the pain never goes away completely; most serious pain is constant and it progresses)     - If intermittent: "How long does it last?" "Do you have pain now?"     (Note: Intermittent means the pain goes away completely between bouts)     Sharp pains come and go 6. SEVERITY: "How bad is the pain?"  (e.g., Scale 1-10; mild, moderate, or severe)    - MILD (1-3): doesn't interfere with normal activities, abdomen soft and not tender to touch     - MODERATE (4-7): interferes with normal activities or awakens from sleep, tender to touch     - SEVERE (8-10): excruciating pain, doubled over, unable to do any normal activities       Severe, been in bed for 3 days 7. RECURRENT SYMPTOM: "Have you ever had this type of abdominal pain before?" If so, ask: "When was the last time?" and "What happened that time?"   no 8. CAUSE: "What do you think is causing the abdominal pain?"     Maybe a virus. Had a party recently with kids, one was sick with vomiting. 9. RELIEVING/AGGRAVATING FACTORS: "What makes it better or worse?" (e.g., movement, antacids, bowel movement)     Tums, pepto help slighty.  10. OTHER SYMPTOMS: "Has there been any vomiting, diarrhea,  constipation, or urine problems?"      Urine this morning dark and smelled strong  Protocols used: ABDOMINAL PAIN - MALE-A-AH

## 2017-11-22 NOTE — Telephone Encounter (Signed)
Patient seen By Jarold MottoSamantha Worley and transported via EMS to hospital.

## 2017-11-22 NOTE — ED Provider Notes (Signed)
[facility]-EMERGENCY DEPT Provider Note   CSN: [id number] Arrival date & time: 11/22/17  1248     History   Chief Complaint Chief Complaint  Patient presents with  . Abdominal Pain     HPI Christopher Blair is a 57 y.o. male. History of myasthenia gravis which is been stable and in remission for 20 years complaining of diffuse abdominal pain upper radiating to the back with associated nausea and vomiting that has been going on for 3 days.  States he is not constipated or having diarrhea.  There is been no blood from above or below.  He denies fever.  States he short of breath because when he takes a deep breath it makes his abdomen hurt more.  The pain is waxing and waning with an intensity of 8 out of 10 at its most.  He is never had this before.  He has no surgical history.  The history is provided by the patient.  Abdominal Pain   This is a new problem. The current episode started more than 2 days ago. The problem occurs constantly. The problem has been gradually worsening. The pain is associated with an unknown factor. The pain is located in the epigastric region. The quality of the pain is aching. The pain is at a severity of 8/10. Associated symptoms include anorexia, nausea and vomiting. Pertinent negatives include fever, diarrhea, melena, constipation, dysuria, frequency, hematuria and arthralgias. Nothing aggravates the symptoms. Nothing relieves the symptoms. Past workup does not include surgery.    Past Medical History:  Diagnosis Date  . Hypothyroidism   . Myasthenia gravis 1994   in remission since 1996    Patient Active Problem List   Diagnosis Date Noted  . Hyperlipidemia 07/29/2015  . Family history of MI (myocardial infarction) 10/19/2014  . Hypothyroidism 01/05/2014  . Myasthenia gravis (HCC) 02/08/2010    Past Surgical History:  Procedure Laterality Date  . TOTAL THYMECTOMY  1994       Home Medications    Prior to Admission  medications   Medication Sig Start Date End Date Taking? Authorizing Provider  aspirin 81 MG tablet Take 81 mg by mouth daily. Reported on 12/27/2015   Yes [provider]  ibuprofen (ADVIL,MOTRIN) 200 MG tablet Take 400 mg by mouth every 6 (six) hours as needed. Reported on 12/27/2015   Yes [provider]  levothyroxine (SYNTHROID, LEVOTHROID) 100 MCG tablet TAKE 1 TABLET BY MOUTH ONCE DAILY 06/01/17  Yes Shelva Majestic, MD    Family History Family History  Problem Relation Age of Onset  . Cancer Mother 55       breast ca  . Heart disease Father        AMI-61, nonsmoker, died 27  . Migraines Son        and tics  . Anxiety disorder Daughter     Social History Social History   Tobacco Use  . Smoking status: Never Smoker  . Smokeless tobacco: Never Used  Substance Use Topics  . Alcohol use: Yes    Comment: occasional  . Drug use: No     Allergies   Mucinex [guaifenesin er]   Review of Systems Review of Systems  Constitutional: Negative for chills and fever.  HENT: Negative for ear pain and sore throat.   Eyes: Negative for pain and visual disturbance.  Respiratory: Positive for shortness of breath. Negative for cough.   Cardiovascular: Negative for chest pain and palpitations.  Gastrointestinal: Positive for abdominal  pain, anorexia, nausea and vomiting. Negative for blood in stool, constipation, diarrhea and melena.  Genitourinary: Negative for dysuria, frequency and hematuria.  Musculoskeletal: Negative for arthralgias and back pain.  Skin: Negative for color change and rash.  Neurological: Negative for seizures and syncope.  All other systems reviewed and are negative.    Physical Exam Updated Vital Signs BP 132/87 (BP Location: Left Arm)   Pulse (!) 113   Temp 98.7 F (37.1 C) (Oral)   Resp 18   SpO2 96%   Physical Exam  Constitutional: He appears well-developed and well-nourished.  HENT:  Head: Normocephalic and atraumatic.  Eyes:  Conjunctivae are normal.  Neck: Neck supple.  Cardiovascular: Regular rhythm. Tachycardia present.  No murmur heard. Pulmonary/Chest: Effort normal and breath sounds normal. No respiratory distress.  Abdominal: Soft. Normal appearance and bowel sounds are normal. There is generalized tenderness and tenderness in the epigastric area. There is no rebound and no guarding. No hernia.  Musculoskeletal: He exhibits no edema.  Neurological: He is alert.  Skin: Skin is warm and dry.  Psychiatric: He has a normal mood and affect.  Nursing note and vitals reviewed.    ED Treatments / Results  Labs (all labs ordered are listed, but only abnormal results are displayed) Labs Reviewed  LIPASE, BLOOD - Abnormal; Notable for the following components:      Result Value   Lipase 1,167 (*)    All other components within normal limits  COMPREHENSIVE METABOLIC PANEL - Abnormal; Notable for the following components:   CO2 21 (*)    Glucose, Bld 131 (*)    AST 259 (*)    ALT 677 (*)    Alkaline Phosphatase 471 (*)    Total Bilirubin 8.8 (*)    All other components within normal limits  CBC - Abnormal; Notable for the following components:   WBC 26.9 (*)    Hemoglobin 17.2 (*)    All other components within normal limits  URINALYSIS, ROUTINE W REFLEX MICROSCOPIC - Abnormal; Notable for the following components:   Color, Urine AMBER (*)    APPearance HAZY (*)    Hgb urine dipstick MODERATE (*)    Bilirubin Urine MODERATE (*)    Ketones, ur 5 (*)    Protein, ur 30 (*)    Bacteria, UA FEW (*)    Squamous Epithelial / LPF 0-5 (*)    All other components within normal limits    EKG  EKG Interpretation  Date/Time:  Thursday November 22 2017 19:59:07 EST Ventricular Rate:  106 PR Interval:    QRS Duration: 97 QT Interval:  326 QTC Calculation: 433 R Axis:   3 Text Interpretation:  Sinus tachycardia Abnormal R-wave progression, early transition Confirmed by Meridee Score [phone]) on  11/22/2017 8:05:13 PM Also confirmed by Meridee Score [phone]), editor Elita Quick ([id number])  on 11/23/2017 7:25:55 AM       Radiology Mr 3d Recon At Scanner  Result Date: 11/22/2017 CLINICAL DATA:  Abdominal pain and elevated LFTs. Clinical concern for gallstone pancreatitis. EXAM: MRI ABDOMEN WITHOUT AND WITH CONTRAST (INCLUDING MRCP) TECHNIQUE: Multiplanar multisequence MR imaging of the abdomen was performed both before and after the administration of intravenous contrast. Heavily T2-weighted images of the biliary and pancreatic ducts were obtained, and three-dimensional MRCP images were rendered by post processing. CONTRAST:  18mL MULTIHANCE GADOBENATE DIMEGLUMINE 529 MG/ML IV SOLN COMPARISON:  None. FINDINGS: Lower chest: Right base atelectasis with tiny right pleural effusion Hepatobiliary: No focal abnormality in the liver  parenchyma. Mild intra and extrahepatic biliary duct dilatation. Extrahepatic common duct measures 8 mm diameter. Common bile duct as it enters the head of the pancreas measures 7 mm diameter. 2 separate and potentially 3 filling defects are identified in the distal common bile duct just proximal to the ampulla. These are well demonstrated on coronal image 26 of series 9. The most distal measures about 3 mm and the most proximal measures about 6 mm (image 26 series 5). Numerous tiny gallstones are identified in the gallbladder lumen. Pancreas: There is diffuse edema in and around the head of pancreas no dilatation of the main pancreatic duct. No evidence for fluid collection in or adjacent to the pancreas. Spleen:  No splenomegaly. No focal mass lesion. Adrenals/Urinary Tract: No adrenal nodule or mass. Right kidney unremarkable. 7 mm T1 hypointense, T2 intermediate to hyperintense lesion is identified in the medial parenchyma of the interpolar left kidney. After IV contrast administration, there is a question of enhancing mural nodule. Stomach/Bowel: Stomach is nondistended.  No gastric wall thickening. No evidence of outlet obstruction. Duodenal wall thickening is evident. Possible duodenal diverticulum transverse segment. No small bowel or colonic dilatation within the visualized abdomen. Vascular/Lymphatic: No abdominal aortic aneurysm no abdominal lymphadenopathy Other:  No substantial intraperitoneal free fluid. Musculoskeletal: No abnormal marrow enhancement within the visualized bony anatomy. IMPRESSION: 1. Cholelithiasis with mild intra and extrahepatic biliary duct dilatation. 2, potentially 3, stones are seen in the distal common bile duct just proximal to the ampulla. 2. Edematous enlargement of the pancreatic head with edema in and around the pancreas and duodenum. No evidence for pancreatic pseudocyst. No dilatation of the main pancreatic duct. Electronically Signed   By: Kennith Center M.D.   On: 11/22/2017 19:31   Dg Ercp  Result Date: 11/24/2017 CLINICAL DATA:  Choledocholithiasis by MRCP EXAM: ERCP with endoscopic stent placement TECHNIQUE: Multiple spot images obtained with the fluoroscopic device and submitted for interpretation post-procedure. FLUOROSCOPY TIME:  Fluoroscopy Time:  1 minute 16 seconds Radiation Exposure Index (if provided by the fluoroscopic device): 15.26 mGy Number of Acquired Spot Images: 3 COMPARISON:  11/22/2017 FINDINGS: Spot fluoroscopic imaging during the ERCP. Mild diffuse biliary dilatation. No definite stricture or obstruction pattern. No large filling defect. Lower CBD endoscopic stent inserted. IMPRESSION: Limited imaging during ERCP. Mild biliary dilatation. Distal CBD endoscopic stent placed. These images were submitted for radiologic interpretation only. Please see the procedural report for the amount of contrast and the fluoroscopy time utilized. Electronically Signed   By: Judie Petit.  Shick M.D.   On: 11/24/2017 08:56   Mr Abdomen Mrcp Vivien Rossetti Contast  Result Date: 11/22/2017 CLINICAL DATA:  Abdominal pain and elevated LFTs. Clinical  concern for gallstone pancreatitis. EXAM: MRI ABDOMEN WITHOUT AND WITH CONTRAST (INCLUDING MRCP) TECHNIQUE: Multiplanar multisequence MR imaging of the abdomen was performed both before and after the administration of intravenous contrast. Heavily T2-weighted images of the biliary and pancreatic ducts were obtained, and three-dimensional MRCP images were rendered by post processing. CONTRAST:  18mL MULTIHANCE GADOBENATE DIMEGLUMINE 529 MG/ML IV SOLN COMPARISON:  None. FINDINGS: Lower chest: Right base atelectasis with tiny right pleural effusion Hepatobiliary: No focal abnormality in the liver parenchyma. Mild intra and extrahepatic biliary duct dilatation. Extrahepatic common duct measures 8 mm diameter. Common bile duct as it enters the head of the pancreas measures 7 mm diameter. 2 separate and potentially 3 filling defects are identified in the distal common bile duct just proximal to the ampulla. These are well demonstrated on coronal image  26 of series 9. The most distal measures about 3 mm and the most proximal measures about 6 mm (image 26 series 5). Numerous tiny gallstones are identified in the gallbladder lumen. Pancreas: There is diffuse edema in and around the head of pancreas no dilatation of the main pancreatic duct. No evidence for fluid collection in or adjacent to the pancreas. Spleen:  No splenomegaly. No focal mass lesion. Adrenals/Urinary Tract: No adrenal nodule or mass. Right kidney unremarkable. 7 mm T1 hypointense, T2 intermediate to hyperintense lesion is identified in the medial parenchyma of the interpolar left kidney. After IV contrast administration, there is a question of enhancing mural nodule. Stomach/Bowel: Stomach is nondistended. No gastric wall thickening. No evidence of outlet obstruction. Duodenal wall thickening is evident. Possible duodenal diverticulum transverse segment. No small bowel or colonic dilatation within the visualized abdomen. Vascular/Lymphatic: No abdominal  aortic aneurysm no abdominal lymphadenopathy Other:  No substantial intraperitoneal free fluid. Musculoskeletal: No abnormal marrow enhancement within the visualized bony anatomy. IMPRESSION: 1. Cholelithiasis with mild intra and extrahepatic biliary duct dilatation. 2, potentially 3, stones are seen in the distal common bile duct just proximal to the ampulla. 2. Edematous enlargement of the pancreatic head with edema in and around the pancreas and duodenum. No evidence for pancreatic pseudocyst. No dilatation of the main pancreatic duct. Electronically Signed   By: Kennith CenterEric  Mansell M.D.   On: 11/22/2017 19:31    Procedures Procedures (including critical care time)  Medications Ordered in ED Medications  morphine 4 MG/ML injection 4 mg (not administered)  sodium chloride 0.9 % bolus 1,000 mL (not administered)  ondansetron (ZOFRAN) injection 4 mg (not administered)     Initial Impression / Assessment and Plan / ED Course  I have reviewed the triage vital signs and the nursing notes.  Pertinent labs & imaging results that were available during my care of the patient were reviewed by me and considered in my medical decision making (see chart for details).  Clinical Course as of Nov 24 1150  Thu Nov 22, 2017  1703 Discussed with GI on-call who agrees this is likely obstructive and recommends an MRCP and IV fluids.  He does not feel patient needs antibiotics currently attributes this to inflammation and hemoconcentration.  The patient will be admitted to the hospitalist service with a GI consult.  Hospitalist has been paged.  [MB]    Clinical Course User Index [MB] Terrilee FilesButler, Michael C, MD  57 year old male with remote history of myasthenia gravis here with worsening upper abdominal pain radiating to his back.  His initial screening labs show an elevated lipase and an obstructive pattern of his LFTs.  He also has an elevated white count.  We are establishing an IV giving him fluids and pain medicine.   GI consult has been placed and waiting for callback from them.  My concern is that he probably has some sort of obstructive pancreatitis, likely stone versus mass.  He will need some further imaging.   Final Clinical Impressions(s) / ED Diagnoses   Final diagnoses:  LFTs abnormal  Bile duct stone    ED Discharge Orders    None       Terrilee FilesButler, Michael C, MD 11/24/17 1153

## 2017-11-22 NOTE — ED Notes (Signed)
Pt to MRI

## 2017-11-22 NOTE — ED Notes (Signed)
ED TO INPATIENT HANDOFF REPORT  Name/Age/Gender Christopher Blair 57 y.o. male  Code Status Code Status History    This patient does not have a recorded code status. Please follow your organizational policy for patients in this situation.      Home/SNF/Other Home  Chief Complaint abd pain   Level of Care/Admitting Diagnosis ED Disposition    ED Disposition Condition Comment   Admit  Hospital Area: [facility] [[id number]]  Level of Care: Telemetry [5]  Admit to tele based on following criteria: Complex arrhythmia (Bradycardia/Tachycardia)  Diagnosis: Gallstone pancreatitis [[id number]]  Admitting Physician: Maryruth Hancock  Attending Physician: Maryruth Hancock  Estimated length of stay: 3 - 4 days  Certification:: I certify this patient will need inpatient services for at least 2 midnights  PT Class (Do Not Modify): Inpatient [101]  PT Acc Code (Do Not Modify): Private [1]       Medical History Past Medical History:  Diagnosis Date  . Hypothyroidism   . Myasthenia gravis 1994   in remission since 1996    Allergies Allergies  Allergen Reactions  . Mucinex [Guaifenesin Er] Other (See Comments)    vomiting    IV Location/Drains/Wounds Patient Lines/Drains/Airways Status   Active Line/Drains/Airways    None          Labs/Imaging Results for orders placed or performed during the hospital encounter of 11/22/17 (from the past 48 hour(s))  Lipase, blood     Status: Abnormal   Collection Time: 11/22/17  1:29 PM  Result Value Ref Range   Lipase 1,167 (H) 11 - 51 U/L    Comment: RESULTS CONFIRMED BY MANUAL DILUTION Performed at [facility] [address], [place], [place] [address]   Comprehensive metabolic panel     Status: Abnormal   Collection Time: 11/22/17  1:29 PM  Result Value Ref Range   Sodium 136 135 - 145 mmol/L   Potassium 3.6 3.5 - 5.1 mmol/L   Chloride 103 101 - 111 mmol/L   CO2 21 (L) 22 - 32  mmol/L   Glucose, Bld 131 (H) 65 - 99 mg/dL   BUN 18 6 - 20 mg/dL   Creatinine, Ser 0.87 0.61 - 1.24 mg/dL   Calcium 8.9 8.9 - 10.3 mg/dL   Total Protein 7.8 6.5 - 8.1 g/dL   Albumin 4.0 3.5 - 5.0 g/dL   AST 259 (H) 15 - 41 U/L   ALT 677 (H) 17 - 63 U/L   Alkaline Phosphatase 471 (H) 38 - 126 U/L   Total Bilirubin 8.8 (H) 0.3 - 1.2 mg/dL   GFR calc non Af Amer >60 >60 mL/min   GFR calc Af Amer >60 >60 mL/min    Comment: (NOTE) The eGFR has been calculated using the CKD EPI equation. This calculation has not been validated in all clinical situations. eGFR's persistently <60 mL/min signify possible Chronic Kidney Disease.    Anion gap 12 5 - 15    Comment: Performed at [facility], [place] [address] [place], [place] [address]  CBC     Status: Abnormal   Collection Time: 11/22/17  1:29 PM  Result Value Ref Range   WBC 26.9 (H) 4.0 - 10.5 K/uL   RBC 5.39 4.22 - 5.81 MIL/uL   Hemoglobin 17.2 (H) 13.0 - 17.0 g/dL   HCT 49.0 39.0 - 52.0 %   MCV 90.9 78.0 - 100.0 fL   MCH 31.9 26.0 - 34.0 pg  MCHC 35.1 30.0 - 36.0 g/dL   RDW 13.8 11.5 - 15.5 %   Platelets 288 150 - 400 K/uL    Comment: Performed at [facility], [place] [address] [place], [place] [address]  Urinalysis, Routine w reflex microscopic     Status: Abnormal   Collection Time: 11/22/17  1:54 PM  Result Value Ref Range   Color, Urine AMBER (A) YELLOW    Comment: BIOCHEMICALS MAY BE AFFECTED BY COLOR   APPearance HAZY (A) CLEAR   Specific Gravity, Urine 1.025 1.005 - 1.030   pH 5.0 5.0 - 8.0   Glucose, UA NEGATIVE NEGATIVE mg/dL   Hgb urine dipstick MODERATE (A) NEGATIVE   Bilirubin Urine MODERATE (A) NEGATIVE   Ketones, ur 5 (A) NEGATIVE mg/dL   Protein, ur 30 (A) NEGATIVE mg/dL   Nitrite NEGATIVE NEGATIVE   Leukocytes, UA NEGATIVE NEGATIVE   RBC / HPF 0-5 0 - 5 RBC/hpf   WBC, UA 0-5 0 - 5 WBC/hpf   Bacteria, UA FEW (A) NONE SEEN   Squamous Epithelial / LPF 0-5 (A) NONE SEEN    Mucus PRESENT    Hyaline Casts, UA PRESENT     Comment: Performed at [facility], [place] [address] [place], [place] [address]  Protime-INR     Status: Abnormal   Collection Time: 11/22/17  5:43 PM  Result Value Ref Range   Prothrombin Time 15.6 (H) 11.4 - 15.2 seconds   INR 1.25     Comment: Performed at [facility], [place] [address], [place], [place] [address]  I-Stat CG4 Lactic Acid, ED     Status: None   Collection Time: 11/22/17  6:09 PM  Result Value Ref Range   Lactic Acid, Venous 1.53 0.5 - 1.9 mmol/L   No results found.  Pending Labs Unresulted Labs (From admission, onward)   Start     Ordered   11/22/17 1833  Culture, blood (x 2)  BLOOD CULTURE X 2,   STAT    Comments:  INITIATE ANTIBIOTICS WITHIN 1 HOUR AFTER BLOOD CULTURES DRAWN.  If unable to obtain blood cultures, call MD immediately regarding antibiotic instructions.    11/22/17 1832   11/22/17 1833  Procalcitonin  STAT,   R     11/22/17 1832   11/22/17 1833  Protime-INR  STAT,   R     11/22/17 1832   11/22/17 1833  APTT  STAT,   R     11/22/17 1832   11/22/17 1743  ABO/Rh  Once,   R     11/22/17 1743   11/22/17 1654  Type and screen [place]  Once,   STAT    Comments:  [place]    11/22/17 1653   Signed and Held  HIV antibody (Routine Testing)  Once,   R     Signed and Held   Signed and Held  CBC  (enoxaparin (LOVENOX)    CrCl >/= 30 ml/min)  Once,   R    Comments:  Baseline for enoxaparin therapy IF NOT ALREADY DRAWN.  Notify MD if PLT < 100 K.    Signed and Held   Signed and Held  Creatinine, serum  (enoxaparin (LOVENOX)    CrCl >/= 30 ml/min)  Once,   R    Comments:  Baseline for enoxaparin therapy IF NOT ALREADY DRAWN.    Signed and Held   Signed and Held  Creatinine, serum  (enoxaparin (LOVENOX)  CrCl >/= 30 ml/min)  Weekly,   R    Comments:  while on enoxaparin therapy    Signed and Held   Signed and Held  TSH   Tomorrow morning,   R     Signed and Held   Signed and Held  Comprehensive metabolic panel  Tomorrow morning,   R     Signed and Held   Signed and Held  CBC  Tomorrow morning,   R     Signed and Held   Signed and Held  Lipase, blood  Tomorrow morning,   R     Signed and Held      Vitals/Pain Today's Vitals   11/22/17 1257 11/22/17 1311 11/22/17 1604 11/22/17 1800  BP:  (!) 127/98 132/87 131/85  Pulse:  (!) 121 (!) 113 (!) 106  Resp:  '18 18 18  '$ Temp:  98.7 F (37.1 C)    TempSrc:  Oral    SpO2:  96% 96% 93%  PainSc: 8        Isolation Precautions No active isolations  Medications Medications  levothyroxine (SYNTHROID, LEVOTHROID) injection 50 mcg (not administered)  piperacillin-tazobactam (ZOSYN) IVPB 3.375 g (not administered)  piperacillin-tazobactam (ZOSYN) IVPB 3.375 g (not administered)  morphine 4 MG/ML injection 4 mg (4 mg Intravenous Given 11/22/17 1659)  sodium chloride 0.9 % bolus 1,000 mL (1,000 mLs Intravenous New Bag/Given 11/22/17 1659)  ondansetron (ZOFRAN) injection 4 mg (4 mg Intravenous Given 11/22/17 1659)  gadobenate dimeglumine (MULTIHANCE) injection 20 mL (18 mLs Intravenous Contrast Given 11/22/17 1855)    Mobility walks

## 2017-11-22 NOTE — Progress Notes (Signed)
Pharmacy Antibiotic Note  Christopher AsalJoseph R Woolverton is a 57 y.o. male admitted on 11/22/2017 with know hx of myasthenia gravis that has been stable and in remission for ~ 20 yeats presentled to ED with 3 days of diffuse abd pain, associated with N/V.    Pharmacy has been consulted for zosyn dosing for intra-abdominal infection.  Plan: -Zosyn 3.375mg  IV x 1 in ED over 30 minutes then continue 3.375mg  IV q8h extended interval - pharmacy will sign off as renal adjustment unlikely      Temp (24hrs), Avg:98.2 F (36.8 C), Min:97.7 F (36.5 C), Max:98.7 F (37.1 C)  Recent Labs  Lab 11/22/17 1329 11/22/17 1809  WBC 26.9*  --   CREATININE 0.87  --   LATICACIDVEN  --  1.53    Estimated Creatinine Clearance: 100.4 mL/min (by C-G formula based on SCr of 0.87 mg/dL).    Allergies  Allergen Reactions  . Mucinex [Guaifenesin Er] Other (See Comments)    vomiting     Thank you for allowing pharmacy to be a part of this patient's care.  Arley PhenixEllen Sherill Mangen RPh 11/22/2017, 6:40 PM Pager [phone]

## 2017-11-22 NOTE — Telephone Encounter (Signed)
See note

## 2017-11-22 NOTE — ED Notes (Signed)
ED TO INPATIENT HANDOFF REPORT  Name/Age/Gender Christopher Blair 57 y.o. male  Code Status Code Status History    This patient does not have a recorded code status. Please follow your organizational policy for patients in this situation.      Home/SNF/Other Home  Chief Complaint abd pain   Level of Care/Admitting Diagnosis ED Disposition    ED Disposition Condition Comment   Admit  Hospital Area: [facility] [[id number]]  Level of Care: Telemetry [5]  Admit to tele based on following criteria: Complex arrhythmia (Bradycardia/Tachycardia)  Diagnosis: Gallstone pancreatitis [935701]  Admitting Physician: Maryruth Hancock  Attending Physician: Maryruth Hancock  Estimated length of stay: 3 - 4 days  Certification:: I certify this patient will need inpatient services for at least 2 midnights  PT Class (Do Not Modify): Inpatient [101]  PT Acc Code (Do Not Modify): Private [1]       Medical History Past Medical History:  Diagnosis Date  . Hypothyroidism   . Myasthenia gravis 1994   in remission since 1996    Allergies Allergies  Allergen Reactions  . Mucinex [Guaifenesin Er] Other (See Comments)    vomiting    IV Location/Drains/Wounds Patient Lines/Drains/Airways Status   Active Line/Drains/Airways    None          Labs/Imaging Results for orders placed or performed during the hospital encounter of 11/22/17 (from the past 48 hour(s))  Lipase, blood     Status: Abnormal   Collection Time: 11/22/17  1:29 PM  Result Value Ref Range   Lipase 1,167 (H) 11 - 51 U/L    Comment: RESULTS CONFIRMED BY MANUAL DILUTION Performed at [facility] [address], [place], [place] [address]   Comprehensive metabolic panel     Status: Abnormal   Collection Time: 11/22/17  1:29 PM  Result Value Ref Range   Sodium 136 135 - 145 mmol/L   Potassium 3.6 3.5 - 5.1 mmol/L   Chloride 103 101 - 111 mmol/L   CO2 21 (L) 22 - 32  mmol/L   Glucose, Bld 131 (H) 65 - 99 mg/dL   BUN 18 6 - 20 mg/dL   Creatinine, Ser 0.87 0.61 - 1.24 mg/dL   Calcium 8.9 8.9 - 10.3 mg/dL   Total Protein 7.8 6.5 - 8.1 g/dL   Albumin 4.0 3.5 - 5.0 g/dL   AST 259 (H) 15 - 41 U/L   ALT 677 (H) 17 - 63 U/L   Alkaline Phosphatase 471 (H) 38 - 126 U/L   Total Bilirubin 8.8 (H) 0.3 - 1.2 mg/dL   GFR calc non Af Amer >60 >60 mL/min   GFR calc Af Amer >60 >60 mL/min    Comment: (NOTE) The eGFR has been calculated using the CKD EPI equation. This calculation has not been validated in all clinical situations. eGFR's persistently <60 mL/min signify possible Chronic Kidney Disease.    Anion gap 12 5 - 15    Comment: Performed at [facility], [place] [address] [place], [place] [address]  CBC     Status: Abnormal   Collection Time: 11/22/17  1:29 PM  Result Value Ref Range   WBC 26.9 (H) 4.0 - 10.5 K/uL   RBC 5.39 4.22 - 5.81 MIL/uL   Hemoglobin 17.2 (H) 13.0 - 17.0 g/dL   HCT 49.0 39.0 - 52.0 %   MCV 90.9 78.0 - 100.0 fL   MCH 31.9 26.0 - 34.0 pg  MCHC 35.1 30.0 - 36.0 g/dL   RDW 13.8 11.5 - 15.5 %   Platelets 288 150 - 400 K/uL    Comment: Performed at [facility], [place] [address] [place], [place] [address]  Urinalysis, Routine w reflex microscopic     Status: Abnormal   Collection Time: 11/22/17  1:54 PM  Result Value Ref Range   Color, Urine AMBER (A) YELLOW    Comment: BIOCHEMICALS MAY BE AFFECTED BY COLOR   APPearance HAZY (A) CLEAR   Specific Gravity, Urine 1.025 1.005 - 1.030   pH 5.0 5.0 - 8.0   Glucose, UA NEGATIVE NEGATIVE mg/dL   Hgb urine dipstick MODERATE (A) NEGATIVE   Bilirubin Urine MODERATE (A) NEGATIVE   Ketones, ur 5 (A) NEGATIVE mg/dL   Protein, ur 30 (A) NEGATIVE mg/dL   Nitrite NEGATIVE NEGATIVE   Leukocytes, UA NEGATIVE NEGATIVE   RBC / HPF 0-5 0 - 5 RBC/hpf   WBC, UA 0-5 0 - 5 WBC/hpf   Bacteria, UA FEW (A) NONE SEEN   Squamous Epithelial / LPF 0-5 (A) NONE SEEN    Mucus PRESENT    Hyaline Casts, UA PRESENT     Comment: Performed at [facility], [place] [address] [place], [place] [address]  Protime-INR     Status: Abnormal   Collection Time: 11/22/17  5:43 PM  Result Value Ref Range   Prothrombin Time 15.6 (H) 11.4 - 15.2 seconds   INR 1.25     Comment: Performed at [facility], [place] [address], [place], [place] [address]  Type and screen [place]     Status: None   Collection Time: 11/22/17  5:43 PM  Result Value Ref Range   ABO/RH(D) B POS    Antibody Screen NEG    Sample Expiration      11/25/2017 Performed at [facility], [place] [address] [place], [place] [address]   I-Stat CG4 Lactic Acid, ED     Status: None   Collection Time: 11/22/17  6:09 PM  Result Value Ref Range   Lactic Acid, Venous 1.53 0.5 - 1.9 mmol/L  Procalcitonin     Status: None   Collection Time: 11/22/17  7:44 PM  Result Value Ref Range   Procalcitonin 2.64 ng/mL    Comment:        Interpretation: PCT > 2 ng/mL: Systemic infection (sepsis) is likely, unless other causes are known. (NOTE)       Sepsis PCT Algorithm           Lower Respiratory Tract                                      Infection PCT Algorithm    ----------------------------     ----------------------------         PCT < 0.25 ng/mL                PCT < 0.10 ng/mL         Strongly encourage             Strongly discourage   discontinuation of antibiotics    initiation of antibiotics    ----------------------------     -----------------------------       PCT 0.25 - 0.50 ng/mL            PCT 0.10 - 0.25 ng/mL  OR       >80% decrease in PCT            Discourage initiation of                                            antibiotics      Encourage discontinuation           of antibiotics    ----------------------------     -----------------------------         PCT >= 0.50 ng/mL              PCT 0.26 - 0.50 ng/mL                AND       <80% decrease in PCT              Encourage initiation of                                             antibiotics       Encourage continuation           of antibiotics    ----------------------------     -----------------------------        PCT >= 0.50 ng/mL                  PCT > 0.50 ng/mL               AND         increase in PCT                  Strongly encourage                                      initiation of antibiotics    Strongly encourage escalation           of antibiotics                                     -----------------------------                                           PCT <= 0.25 ng/mL                                                 OR                                        > 80% decrease in PCT                                     Discontinue / Do not initiate  antibiotics Performed at [facility], [place] [address], [place], [place] [address]   Protime-INR     Status: Abnormal   Collection Time: 11/22/17  7:44 PM  Result Value Ref Range   Prothrombin Time 15.6 (H) 11.4 - 15.2 seconds   INR 1.25     Comment: Performed at [facility], [place] [address] [place], [place] [address]  APTT     Status: None   Collection Time: 11/22/17  7:44 PM  Result Value Ref Range   aPTT 30 24 - 36 seconds    Comment: Performed at [facility], [place] [address] [place],  [address]   Mr 3d Recon At Scanner  Result Date: 11/22/2017 CLINICAL DATA:  Abdominal pain and elevated LFTs. Clinical concern for gallstone pancreatitis. EXAM: MRI ABDOMEN WITHOUT AND WITH CONTRAST (INCLUDING MRCP) TECHNIQUE: Multiplanar multisequence MR imaging of the abdomen was performed both before and after the administration of intravenous contrast. Heavily T2-weighted images of the biliary and pancreatic ducts were obtained, and three-dimensional MRCP images were rendered by  post processing. CONTRAST:  64m MULTIHANCE GADOBENATE DIMEGLUMINE 529 MG/ML IV SOLN COMPARISON:  None. FINDINGS: Lower chest: Right base atelectasis with tiny right pleural effusion Hepatobiliary: No focal abnormality in the liver parenchyma. Mild intra and extrahepatic biliary duct dilatation. Extrahepatic common duct measures 8 mm diameter. Common bile duct as it enters the head of the pancreas measures 7 mm diameter. 2 separate and potentially 3 filling defects are identified in the distal common bile duct just proximal to the ampulla. These are well demonstrated on coronal image 26 of series 9. The most distal measures about 3 mm and the most proximal measures about 6 mm (image 26 series 5). Numerous tiny gallstones are identified in the gallbladder lumen. Pancreas: There is diffuse edema in and around the head of pancreas no dilatation of the main pancreatic duct. No evidence for fluid collection in or adjacent to the pancreas. Spleen:  No splenomegaly. No focal mass lesion. Adrenals/Urinary Tract: No adrenal nodule or mass. Right kidney unremarkable. 7 mm T1 hypointense, T2 intermediate to hyperintense lesion is identified in the medial parenchyma of the interpolar left kidney. After IV contrast administration, there is a question of enhancing mural nodule. Stomach/Bowel: Stomach is nondistended. No gastric wall thickening. No evidence of outlet obstruction. Duodenal wall thickening is evident. Possible duodenal diverticulum transverse segment. No small bowel or colonic dilatation within the visualized abdomen. Vascular/Lymphatic: No abdominal aortic aneurysm no abdominal lymphadenopathy Other:  No substantial intraperitoneal free fluid. Musculoskeletal: No abnormal marrow enhancement within the visualized bony anatomy. IMPRESSION: 1. Cholelithiasis with mild intra and extrahepatic biliary duct dilatation. 2, potentially 3, stones are seen in the distal common bile duct just proximal to the ampulla. 2.  Edematous enlargement of the pancreatic head with edema in and around the pancreas and duodenum. No evidence for pancreatic pseudocyst. No dilatation of the main pancreatic duct. Electronically Signed   By: EMisty StanleyM.D.   On: 11/22/2017 19:31   Mr Abdomen Mrcp WMoise BoringContast  Result Date: 11/22/2017 CLINICAL DATA:  Abdominal pain and elevated LFTs. Clinical concern for gallstone pancreatitis. EXAM: MRI ABDOMEN WITHOUT AND WITH CONTRAST (INCLUDING MRCP) TECHNIQUE: Multiplanar multisequence MR imaging of the abdomen was performed both before and after the administration of intravenous contrast. Heavily T2-weighted images of the biliary and pancreatic ducts were obtained, and three-dimensional MRCP images were rendered by post processing. CONTRAST:  141mMULTIHANCE GADOBENATE DIMEGLUMINE 529 MG/ML IV SOLN COMPARISON:  None. FINDINGS: Lower chest: Right base atelectasis with tiny right pleural effusion Hepatobiliary: No focal abnormality in the liver parenchyma. Mild intra and extrahepatic biliary duct dilatation. Extrahepatic common duct measures 8 mm diameter. Common bile duct as it enters the head of the pancreas measures 7 mm diameter. 2 separate and potentially 3 filling defects are identified in the distal common bile duct just proximal to the ampulla. These are well demonstrated on coronal image 26 of series 9. The most distal measures about 3 mm and the most proximal measures about 6 mm (image 26 series 5). Numerous tiny gallstones are identified in the gallbladder lumen. Pancreas: There is diffuse edema in and around the head of pancreas no dilatation of the main pancreatic duct. No evidence for fluid collection in or adjacent to the pancreas. Spleen:  No splenomegaly. No focal mass lesion. Adrenals/Urinary Tract: No adrenal nodule or mass. Right kidney unremarkable. 7 mm T1 hypointense, T2 intermediate to hyperintense lesion is identified in the medial parenchyma of the interpolar left kidney. After IV  contrast administration, there is a question of enhancing mural nodule. Stomach/Bowel: Stomach is nondistended. No gastric wall thickening. No evidence of outlet obstruction. Duodenal wall thickening is evident. Possible duodenal diverticulum transverse segment. No small bowel or colonic dilatation within the visualized abdomen. Vascular/Lymphatic: No abdominal aortic aneurysm no abdominal lymphadenopathy Other:  No substantial intraperitoneal free fluid. Musculoskeletal: No abnormal marrow enhancement within the visualized bony anatomy. IMPRESSION: 1. Cholelithiasis with mild intra and extrahepatic biliary duct dilatation. 2, potentially 3, stones are seen in the distal common bile duct just proximal to the ampulla. 2. Edematous enlargement of the pancreatic head with edema in and around the pancreas and duodenum. No evidence for pancreatic pseudocyst. No dilatation of the main pancreatic duct. Electronically Signed   By: Misty Stanley M.D.   On: 11/22/2017 19:31    Pending Labs Unresulted Labs (From admission, onward)   Start     Ordered   11/22/17 1833  Culture, blood (x 2)  BLOOD CULTURE X 2,   STAT    Comments:  INITIATE ANTIBIOTICS WITHIN 1 HOUR AFTER BLOOD CULTURES DRAWN.  If unable to obtain blood cultures, call MD immediately regarding antibiotic instructions.    11/22/17 1832   11/22/17 1743  ABO/Rh  Once,   R     11/22/17 1743   Signed and Held  HIV antibody (Routine Testing)  Once,   R     Signed and Held   Signed and Held  CBC  (enoxaparin (LOVENOX)    CrCl >/= 30 ml/min)  Once,   R    Comments:  Baseline for enoxaparin therapy IF NOT ALREADY DRAWN.  Notify MD if PLT < 100 K.    Signed and Held   Signed and Held  Creatinine, serum  (enoxaparin (LOVENOX)    CrCl >/= 30 ml/min)  Once,   R    Comments:  Baseline for enoxaparin therapy IF NOT ALREADY DRAWN.    Signed and Held   Signed and Held  Creatinine, serum  (enoxaparin (LOVENOX)    CrCl >/= 30 ml/min)  Weekly,   R    Comments:   while on enoxaparin therapy    Signed and Held   Signed and Held  TSH  Tomorrow morning,   R     Signed and Held   Signed and Held  Comprehensive metabolic panel  Tomorrow morning,   R     Signed and Held   Signed and Held  CBC  Tomorrow morning,   R     Signed and Held   Signed and Held  Lipase, blood  Tomorrow morning,   R     Signed and Held      Vitals/Pain Today's Vitals   11/22/17 2030 11/22/17 2100 11/22/17 2130 11/22/17 2200  BP: (!) 136/92 (!) 140/105 122/87 128/89  Pulse: (!) 105 (!) 112 (!) 104 (!) 102  Resp: (!) 33 (!) 28 20 (!) 23  Temp:      TempSrc:      SpO2: 95% 94% 95% 93%  PainSc:        Isolation Precautions No active isolations  Medications Medications  levothyroxine (SYNTHROID, LEVOTHROID) injection 50 mcg (not administered)  piperacillin-tazobactam (ZOSYN) IVPB 3.375 g (not administered)  morphine 4 MG/ML injection 4 mg (4 mg Intravenous Given 11/22/17 1659)  sodium chloride 0.9 % bolus 1,000 mL (1,000 mLs Intravenous New Bag/Given 11/22/17 1659)  ondansetron (ZOFRAN) injection 4 mg (4 mg Intravenous Given 11/22/17 1659)  gadobenate dimeglumine (MULTIHANCE) injection 20 mL (18 mLs Intravenous Contrast Given 11/22/17 1855)  piperacillin-tazobactam (ZOSYN) IVPB 3.375 g (0 g Intravenous Stopped 11/22/17 2202)  morphine 4 MG/ML injection 4 mg (4 mg Intravenous Given 11/22/17 2052)  ondansetron (ZOFRAN) injection 4 mg (4 mg Intravenous Given 11/22/17 2052)    Mobility walks

## 2017-11-22 NOTE — Progress Notes (Signed)
Christopher AsalJoseph R Blair is a 57 y.o. male here for a new problem.  I acted as a Neurosurgeonscribe for Energy East CorporationSamantha Laurna Shetley, PA-C [employer]arkDonna Orphanos, LPN.  History of Present Illness:   Chief Complaint  Patient presents with  . Abdominal Pain    Abdominal Pain  This is a new problem. Episode onset: Pt started with abd pain on Tuesday night. The onset quality is sudden. The problem occurs daily. The pain is located in the generalized abdominal region (radiates to mid back area). The pain is at a severity of 9/10. The pain is severe. The quality of the pain is cramping, aching and sharp. Pain radiation: mid back area. Associated symptoms include belching, constipation, headaches, nausea and vomiting. Pertinent negatives include no dysuria, fever or flatus. Associated symptoms comments: Has not moved bowels in two days. Pt normally goes twice a day. Pt vomited on Tuesday small amount milky color.. The pain is aggravated by movement. The pain is relieved by nothing. He has tried antacids (Pepto bismol and Motrin) for the symptoms. The treatment provided mild relief. There is no history of abdominal surgery, colon cancer, gallstones, GERD or irritable bowel syndrome.   He has significant pain that has kept him bedbound over the past two days. Unable to eat secondary to severe pain. His son drove him to the office today.  Past Medical History:  Diagnosis Date  . Hypothyroidism   . Myasthenia gravis 1994   in remission since 1996     Social History   Socioeconomic History  . Marital status: Married    Spouse name: Not on file  . Number of children: Not on file  . Years of education: Not on file  . Highest education level: Not on file  Social Needs  . Financial resource strain: Not on file  . Food insecurity - worry: Not on file  . Food insecurity - inability: Not on file  . Transportation needs - medical: Not on file  . Transportation needs - non-medical: Not on file  Occupational History  . Not on file   Tobacco Use  . Smoking status: Never Smoker  . Smokeless tobacco: Never Used  Substance and Sexual Activity  . Alcohol use: Yes    Comment: occasional  . Drug use: No  . Sexual activity: Not on file  Other Topics Concern  . Not on file  Social History Narrative   Married. 3 children. No grandkids. Oldest daughter getting married in June 2017.       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       Hobbies: essentially no hobbies as works frequently   [employer]     Past Surgical History:  Procedure Laterality Date  . TOTAL THYMECTOMY  1994    Family History  Problem Relation Age of Onset  . Cancer Mother [id number]       breast ca  . Heart disease Father        AMI-61, nonsmoker, died 6762  . Migraines Son        and tics  . Anxiety disorder Daughter     Allergies  Allergen Reactions  . Mucinex [Guaifenesin Er] Other (See Comments)    vomiting    Current Medications:   Current Outpatient Medications:  .  aspirin 81 MG tablet, Take 81 mg by mouth daily. Reported on 12/27/2015, Disp: , Rfl:  .  ibuprofen (ADVIL,MOTRIN) 200 MG tablet, Take 400  mg by mouth every 6 (six) hours as needed. Reported on 12/27/2015, Disp: , Rfl:  .  levothyroxine (SYNTHROID, LEVOTHROID) 100 MCG tablet, TAKE 1 TABLET BY MOUTH ONCE DAILY, Disp: 90 tablet, Rfl: 1   Review of Systems:   Review of Systems  Constitutional: Negative for fever.  Gastrointestinal: Positive for abdominal pain, constipation, nausea and vomiting. Negative for flatus.  Genitourinary: Negative for dysuria.  Neurological: Positive for headaches.    Vitals:   Vitals:   11/22/17 1138  BP: 128/86  Pulse: (!) 123  Temp: 97.7 F (36.5 C)  TempSrc: Oral  SpO2: 94%  Weight: 197 lb 4 oz (89.5 kg)  Height: 5' 7.25" (1.708 m)     Body mass index is 30.66 kg/m.  Physical Exam:   Physical Exam  Constitutional: He appears well-developed. He is  cooperative.  Non-toxic appearance. He does not have a sickly appearance. He appears ill. He appears distressed.  Cardiovascular: Normal rate, regular rhythm, S1 normal, S2 normal, normal heart sounds and normal pulses.  No LE edema  Pulmonary/Chest: Effort normal and breath sounds normal.  Abdominal: There is tenderness in the epigastric area and periumbilical area. There is guarding. There is no rigidity and no rebound.  Neurological: He is alert. GCS eye subscore is 4. GCS verbal subscore is 5. GCS motor subscore is 6.  Skin: Skin is warm, dry and intact.  Psychiatric: He has a normal mood and affect. His speech is normal and behavior is normal.  Nursing note and vitals reviewed.   Assessment and Plan:    Christopher Blair was seen today for abdominal pain.  Diagnoses and all orders for this visit:  Abdominal pain, unspecified abdominal location   Given patient's vitals, severity of pain and physical exam findings, patient would best be served in the ER. Patient also briefly evaluated by Dr. [name], who was in agreement. Follow-up in office after hospital visit based on findings. Patient present with son, Christopher Blair, who was agreeable to plan of care.  . Reviewed expectations re: course of current medical issues. . Discussed self-management of symptoms. . Outlined signs and symptoms indicating need for more acute intervention. . Patient verbalized understanding and all questions were answered. . See orders for this visit as documented in the electronic medical record. . Patient received an After-Visit Summary.  CMA or LPN served as scribe during this visit. History, Physical, and Plan performed by medical provider. Documentation and orders reviewed and attested to.  Jarold Motto, PA-C

## 2017-11-22 NOTE — ED Notes (Signed)
Pt is aware a urine sample is needed, but is unable to provide one at this time. 

## 2017-11-23 DIAGNOSIS — K851 Biliary acute pancreatitis without necrosis or infection: Secondary | ICD-10-CM

## 2017-11-23 LAB — SURGICAL PCR SCREEN
MRSA, PCR: NEGATIVE
Staphylococcus aureus: POSITIVE — AB

## 2017-11-23 LAB — CBC
HEMATOCRIT: 43.1 % (ref 39.0–52.0)
HEMOGLOBIN: 14.6 g/dL (ref 13.0–17.0)
MCH: 31.4 pg (ref 26.0–34.0)
MCHC: 33.9 g/dL (ref 30.0–36.0)
MCV: 92.7 fL (ref 78.0–100.0)
PLATELETS: 232 10*3/uL (ref 150–400)
RBC: 4.65 MIL/uL (ref 4.22–5.81)
RDW: 14 % (ref 11.5–15.5)
WBC: 23.7 10*3/uL — AB (ref 4.0–10.5)

## 2017-11-23 LAB — COMPREHENSIVE METABOLIC PANEL
ALT: 434 U/L — AB (ref 17–63)
ANION GAP: 11 (ref 5–15)
AST: 122 U/L — ABNORMAL HIGH (ref 15–41)
Albumin: 3.3 g/dL — ABNORMAL LOW (ref 3.5–5.0)
Alkaline Phosphatase: 330 U/L — ABNORMAL HIGH (ref 38–126)
BUN: 19 mg/dL (ref 6–20)
CHLORIDE: 106 mmol/L (ref 101–111)
CO2: 22 mmol/L (ref 22–32)
CREATININE: 1.03 mg/dL (ref 0.61–1.24)
Calcium: 8.2 mg/dL — ABNORMAL LOW (ref 8.9–10.3)
Glucose, Bld: 116 mg/dL — ABNORMAL HIGH (ref 65–99)
POTASSIUM: 3.5 mmol/L (ref 3.5–5.1)
SODIUM: 139 mmol/L (ref 135–145)
Total Bilirubin: 3.8 mg/dL — ABNORMAL HIGH (ref 0.3–1.2)
Total Protein: 6.7 g/dL (ref 6.5–8.1)

## 2017-11-23 LAB — HIV ANTIBODY (ROUTINE TESTING W REFLEX): HIV SCREEN 4TH GENERATION: NONREACTIVE

## 2017-11-23 LAB — TSH: TSH: 2.073 u[IU]/mL (ref 0.350–4.500)

## 2017-11-23 LAB — LIPASE, BLOOD: Lipase: 136 U/L — ABNORMAL HIGH (ref 11–51)

## 2017-11-23 MED ORDER — MUPIROCIN 2 % EX OINT
1.0000 "application " | TOPICAL_OINTMENT | Freq: Two times a day (BID) | CUTANEOUS | Status: DC
  Administered 2017-11-23 – 2017-11-27 (×9): 1 via NASAL
  Filled 2017-11-23: qty 22

## 2017-11-23 MED ORDER — CHLORHEXIDINE GLUCONATE CLOTH 2 % EX PADS
6.0000 | MEDICATED_PAD | Freq: Every day | CUTANEOUS | Status: DC
  Administered 2017-11-23 – 2017-11-26 (×3): 6 via TOPICAL

## 2017-11-23 MED ORDER — METOCLOPRAMIDE HCL 5 MG/ML IJ SOLN
10.0000 mg | Freq: Three times a day (TID) | INTRAMUSCULAR | Status: DC | PRN
  Administered 2017-11-23 – 2017-11-24 (×3): 10 mg via INTRAVENOUS
  Filled 2017-11-23 (×3): qty 2

## 2017-11-23 MED ORDER — ACETAMINOPHEN 325 MG PO TABS
650.0000 mg | ORAL_TABLET | Freq: Four times a day (QID) | ORAL | Status: DC | PRN
  Administered 2017-11-23 – 2017-11-26 (×6): 650 mg via ORAL
  Filled 2017-11-23 (×7): qty 2

## 2017-11-23 MED ORDER — SODIUM CHLORIDE 0.9 % IV SOLN: INTRAVENOUS | Status: DC

## 2017-11-23 NOTE — Progress Notes (Signed)
Patient ID: Christopher Blair, male   DOB: [birth year], 57 y.o.   MRN: [id number]    PROGRESS NOTE    Christopher Blair  WUJ:[id number] DOB: December 28, 1960 DOA: 11/22/2017  PCP: Shelva Majestic, MD   Brief Narrative:   History of present illness: Patient is 57 year old male with known history of myasthenia gravis that has been stable and in remission for 20 years, presented to [facility] emergency department with main concern of 3 days duration of diffuse abdominal pain, sharp and intermittent, stabbing like at times, 10/10 in severity, radiating to the back, associated with nausea and nonbloody vomiting, unable to keep anything down. Patient also reports associated dyspnea, pain with deep breaths. Patient denies fevers or chills, no recent sick contacts or exposures, no similar events in the past. Patient denies chest pain, no specific urinary concerns.  In emergency department, patient noted to be in mild distress due to pain, vital signs notable for temperature 98.7, heart rate up to 123, blood pressure 127/98. Blood work notable for WBC 26.9, hemoglobin 17.2, ALT 259, AST 671 lipase 1100. Concern for gallstone pancreatitis. TRH asked to admit for further evaluation. ER doctor consulted with GI on call, recommendation is for stat MRCP, hold off on antibiotics.   Assessment & Plan:   Assessment/Plan Active Problems: Sepsis, Acute diffuse abdominal pain, transaminitis, pancreatitis - per MRCP, cholelithiasis with mild intra-and extrahepatic biliary duct dilatation - Edematous enlargement of the pancreatic head also noted - LFTs overall trending down, lipase trending down - Plan to perform ERCP today - Appreciate GI team assistance - Continue analgesia and antiemetics as needed - Keep nothing by mouth - Continue empiric antibiotic day #2 for now  Elevated hemoglobin - From dehydration - responding to IV fluids, overall hemoglobin stable  Hypothyroidism - Continue IV Synthroid for  now until patient able to take by mouth  DVT prophylaxis: Lovenox SQ Code Status: Full code Family Communication: Patient and wife at bedside  Disposition Plan: To be determined  Consultants:   GI   Procedures:   MRCP 02/14  Antimicrobials:   Zosyn 02/14 -->  Subjective: Still with pain, sharp and intermittent, 7/10 in severity.   Objective: Vitals:   11/22/17 2130 11/22/17 2200 11/22/17 2226 11/23/17 0431  BP: 122/87 128/89 140/89 133/88  Pulse: (!) 104 (!) 102 98 (!) 104  Resp: 20 (!) 23 20 18   Temp:   99.4 F (37.4 C) 98.3 F (36.8 C)  TempSrc:   Oral Oral  SpO2: 95% 93% 95% 90%  Weight:   89.5 kg (197 lb 6.4 oz)   Height:   5\' 8"  (1.727 m)     Intake/Output Summary (Last 24 hours) at 11/23/2017 1114 Last data filed at 11/23/2017 0600 Gross per 24 hour  Intake 985.42 ml  Output -  Net 985.42 ml   Filed Weights   11/22/17 2226  Weight: 89.5 kg (197 lb 6.4 oz)    Examination:  General exam: Appears calm and comfortable  Respiratory system: Clear to auscultation. Respiratory effort normal. Cardiovascular system: S1 & S2 heard, RRR. No JVD, murmurs, rubs, gallops or clicks. No pedal edema. Gastrointestinal system: Abdomen is nondistended, tender in upper abd quadrants  Central nervous system: Alert and oriented. No focal neurological deficits. Extremities: Symmetric 5 x 5 power. Skin: No rashes, lesions or ulcers Psychiatry: Judgement and insight appear normal. Mood & affect appropriate.   Data Reviewed: I have personally reviewed following labs and imaging studies  CBC: Recent Labs  Lab 11/22/17  1329 11/23/17 0352  WBC 26.9* 23.7*  HGB 17.2* 14.6  HCT 49.0 43.1  MCV 90.9 92.7  PLT 288 232   Basic Metabolic Panel: Recent Labs  Lab 11/22/17 1329 11/23/17 0352  NA 136 139  K 3.6 3.5  CL 103 106  CO2 21* 22  GLUCOSE 131* 116*  BUN 18 19  CREATININE 0.87 1.03  CALCIUM 8.9 8.2*   Liver Function Tests: Recent Labs  Lab 11/22/17 1329  11/23/17 0352  AST 259* 122*  ALT 677* 434*  ALKPHOS 471* 330*  BILITOT 8.8* 3.8*  PROT 7.8 6.7  ALBUMIN 4.0 3.3*   Recent Labs  Lab 11/22/17 1329 11/23/17 0352  LIPASE 1,167* 136*   Coagulation Profile: Recent Labs  Lab 11/22/17 1743 11/22/17 1944  INR 1.25 1.25   Thyroid Function Tests: Recent Labs    11/23/17 0352  TSH 2.073   Urine analysis:    Component Value Date/Time   COLORURINE AMBER (A) 11/22/2017 1354   APPEARANCEUR HAZY (A) 11/22/2017 1354   LABSPEC 1.025 11/22/2017 1354   PHURINE 5.0 11/22/2017 1354   GLUCOSEU NEGATIVE 11/22/2017 1354   GLUCOSEU NEGATIVE 05/27/2009 0727   HGBUR MODERATE (A) 11/22/2017 1354   BILIRUBINUR MODERATE (A) 11/22/2017 1354   BILIRUBINUR n 10/27/2015 0931   KETONESUR 5 (A) 11/22/2017 1354   PROTEINUR 30 (A) 11/22/2017 1354   UROBILINOGEN 0.2 10/27/2015 0931   UROBILINOGEN 0.2 05/27/2009 0727   NITRITE NEGATIVE 11/22/2017 1354   LEUKOCYTESUR NEGATIVE 11/22/2017 1354    Recent Results (from the past 240 hour(s))  Surgical PCR screen     Status: Abnormal   Collection Time: 11/22/17 10:56 PM  Result Value Ref Range Status   MRSA, PCR NEGATIVE NEGATIVE Final   Staphylococcus aureus POSITIVE (A) NEGATIVE Final    Comment: (NOTE) The Xpert SA Assay (FDA approved for NASAL specimens in patients 57 years of age and older), is one component of a comprehensive surveillance program. It is not intended to diagnose infection nor to guide or monitor treatment. Performed at [facility], [address] [place], [place] [address]      Radiology Studies: Mr Abdomen Mrcp Vivien RossettiW Wo Contast Result Date: 11/22/2017 1. Cholelithiasis with mild intra and extrahepatic biliary duct dilatation. 2, potentially 3, stones are seen in the distal common bile duct just proximal to the ampulla. 2. Edematous enlargement of the pancreatic head with edema in and around the pancreas and duodenum. No evidence for pancreatic pseudocyst.  No dilatation of the main pancreatic duct.   Scheduled Meds: . Chlorhexidine Gluconate Cloth  6 each Topical Daily  . enoxaparin (LOVENOX) injection  40 mg Subcutaneous QHS  . levothyroxine  50 mcg Intravenous Daily  . mupirocin ointment  1 application Nasal BID   Continuous Infusions: . sodium chloride 125 mL/hr at 11/23/17 0628  . sodium chloride    . piperacillin-tazobactam (ZOSYN)  IV Stopped (11/23/17 0831)     LOS: 1 day   Time spent: 25 minutes   Debbora PrestoIskra Magick-Keo Schirmer, MD [facility] Pager [phone]  If 7PM-7AM, please contact night-coverage [email or web address] Password [email or web address] 11/23/2017, 11:14 AM

## 2017-11-23 NOTE — Progress Notes (Addendum)
Initial Nutrition Assessment  DOCUMENTATION CODES:   Not applicable  INTERVENTION:    Monitor for diet advancement/toleration  Boost Breeze po TID, each supplement provides 250 kcal and 9 grams of protein once advanced  NUTRITION DIAGNOSIS:   Inadequate oral intake related to altered GI function, nausea as evidenced by per patient/family report.  GOAL:   Patient will meet greater than or equal to 90% of their needs  MONITOR:   PO intake, Supplement acceptance, Diet advancement, Weight trends, Labs, I & O's  REASON FOR ASSESSMENT:   Malnutrition Screening Tool    ASSESSMENT:   Pt with PMH significant for myasthenia gravis (in remission 20 years). Presents this admission with complaints of diffuse abdominal pain for three days. Admitted for gallstone pancreatitis. MRCP scheduled for 2/15.    Pt reports had normal appetite (eating three meals per day with snacks) up until 3 days PTA. States he would eat and have immediate pain that worsened each day. Claims that dairy products made his pain even worse. Pt is currently NPO for procedure. Discussed possibly having clear supplementation once diet advanced. Pt amendable.   Pt endorses a 3 lb wt loss in three days. Records indicate pt weighed 206 lb in Jan 2018 and 197 lb this admission. Wt loss is insignificant for time frame. Nutrition-Focused physical exam completed. No depletions found.   Medications reviewed and include: NS @ 125 ml/hr, IV abx Labs reviewed: elevated LFT   NUTRITION - FOCUSED PHYSICAL EXAM:    Most Recent Value  Orbital Region  No depletion  Upper Arm Region  No depletion  Thoracic and Lumbar Region  Unable to assess  Buccal Region  No depletion  Temple Region  No depletion  Clavicle Bone Region  No depletion  Clavicle and Acromion Bone Region  No depletion  Scapular Bone Region  Unable to assess  Dorsal Hand  No depletion  Patellar Region  No depletion  Anterior Thigh Region  No depletion   Posterior Calf Region  No depletion  Edema (RD Assessment)  None     Diet Order:  Diet NPO time specified  EDUCATION NEEDS:   Not appropriate for education at this time  Skin:  Skin Assessment: Reviewed RN Assessment  Last BM:  11/20/17  Height:   Ht Readings from Last 1 Encounters:  11/22/17 5\' 8"  (1.727 m)    Weight:   Wt Readings from Last 1 Encounters:  11/22/17 197 lb 6.4 oz (89.5 kg)    Ideal Body Weight:  70 kg  BMI:  Body mass index is 30.01 kg/m.  Estimated Nutritional Needs:   Kcal:  2000-2200 kcal/day  Protein:  100-110 g/day  Fluid:  >2 L/day    Vanessa Kickarly Sahas Sluka RD, LDN Clinical Nutrition Pager # [phone]

## 2017-11-23 NOTE — H&P (View-Only) (Signed)
Referring Provider:  ER Dr. Charm Barges  Primary Care Physician:  Shelva Majestic, MD Primary Gastroenterologist:  Dr. Levora Angel  Reason for Consultation:  Gallstone pancreatitis  HPI: Christopher Blair is a 57 y.o. male  with past medical history of my [place] previous hyperthyroidism came into the hospital for further evaluation of abdominal pain. Severe and sharp epigastric pain which started 2-3 days ago. Radiation to back.. Associated with nausea and nonbloody vomiting. Complaining of constipation with no bowel movement for last 2-3 days. He denied any fever or chills. Denied similar symptoms in the past.  Was found was found to have elevated LFTs on initial evaluation. Subsequent MRI MRCP was ordered which showed pancreatitis with common bile duct  stones with intra-and extrahepatic biliary ductal dilatation. Past Medical History:  Diagnosis Date  . Hypothyroidism   . Myasthenia gravis 1994   in remission since 1996    Past Surgical History:  Procedure Laterality Date  . TOTAL THYMECTOMY  1994    Prior to Admission medications   Medication Sig Start Date End Date Taking? Authorizing Provider  aspirin 81 MG tablet Take 81 mg by mouth daily. Reported on 12/27/2015   Yes [provider]  ibuprofen (ADVIL,MOTRIN) 200 MG tablet Take 400 mg by mouth every 6 (six) hours as needed. Reported on 12/27/2015   Yes [provider]  levothyroxine (SYNTHROID, LEVOTHROID) 100 MCG tablet TAKE 1 TABLET BY MOUTH ONCE DAILY 06/01/17  Yes Shelva Majestic, MD    Scheduled Meds: . Chlorhexidine Gluconate Cloth  6 each Topical Daily  . enoxaparin (LOVENOX) injection  40 mg Subcutaneous QHS  . levothyroxine  50 mcg Intravenous Daily  . mupirocin ointment  1 application Nasal BID   Continuous Infusions: . sodium chloride 125 mL/hr at 11/23/17 0628  . sodium chloride    . piperacillin-tazobactam (ZOSYN)  IV Stopped (11/23/17 0831)   PRN Meds:.metoCLOPramide (REGLAN) injection,  morphine injection, ondansetron **OR** ondansetron (ZOFRAN) IV  Allergies as of 11/22/2017 - Review Complete 11/22/2017  Allergen Reaction Noted  . Mucinex [guaifenesin er] Other (See Comments) 03/07/2012    Family History  Problem Relation Age of Onset  . Cancer Mother 4       breast ca  . Heart disease Father        AMI-61, nonsmoker, died 62  . Migraines Son        and tics  . Anxiety disorder Daughter     Social History   Socioeconomic History  . Marital status: Married    Spouse name: Not on file  . Number of children: Not on file  . Years of education: Not on file  . Highest education level: Not on file  Social Needs  . Financial resource strain: Not on file  . Food insecurity - worry: Not on file  . Food insecurity - inability: Not on file  . Transportation needs - medical: Not on file  . Transportation needs - non-medical: Not on file  Occupational History  . Not on file  Tobacco Use  . Smoking status: Never Smoker  . Smokeless tobacco: Never Used  Substance and Sexual Activity  . Alcohol use: Yes    Comment: occasional  . Drug use: No  . Sexual activity: Not on file  Other Topics Concern  . Not on file  Social History Narrative   Married. 3 children. No grandkids. Oldest daughter getting married in June 2017.       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       Hobbies: essentially no hobbies as works frequently   Goes to [employer]     Review of Systems: Review of Systems  Constitutional: Negative for chills and fever.  HENT: Negative for hearing loss and tinnitus.   Eyes: Negative for blurred vision and double vision.  Respiratory: Negative for cough, hemoptysis and sputum production.   Cardiovascular: Negative for chest pain and palpitations.  Gastrointestinal: Positive for abdominal pain, constipation, heartburn, nausea and vomiting. Negative for blood in stool, diarrhea  and melena.  Genitourinary: Negative for dysuria and urgency.  Musculoskeletal: Positive for back pain. Negative for myalgias.  Skin: Negative for itching and rash.  Neurological: Negative for seizures and loss of consciousness.  Endo/Heme/Allergies: Does not bruise/bleed easily.  Psychiatric/Behavioral: Negative for hallucinations and suicidal ideas.    Physical Exam: Vital signs: Vitals:   11/22/17 2226 11/23/17 0431  BP: 140/89 133/88  Pulse: 98 (!) 104  Resp: 20 18  Temp: 99.4 F (37.4 C) 98.3 F (36.8 C)  SpO2: 95% 90%   Last BM Date: 11/20/17 Physical Exam  Constitutional: He is oriented to person, place, and time. He appears well-developed and well-nourished. No distress.  HENT:  Head: Normocephalic and atraumatic.  Mouth/Throat: Oropharynx is clear and moist. No oropharyngeal exudate.  Eyes: EOM are normal. Scleral icterus is present.  Neck: Normal range of motion. Neck supple.  Cardiovascular: Normal rate, regular rhythm and normal heart sounds.  Pulmonary/Chest: Effort normal and breath sounds normal. No respiratory distress.  Abdominal: Soft. Bowel sounds are normal. He exhibits no distension. There is tenderness. There is no rebound and no guarding.  Epigastric tenderness to palpation. No peritoneal signs  Musculoskeletal: Normal range of motion. He exhibits no edema.  Neurological: He is alert and oriented to person, place, and time.  Skin: Skin is warm. No erythema.  Psychiatric: He has a normal mood and affect. Thought content normal.  Vitals reviewed.   GI:  Lab Results: Recent Labs    11/22/17 1329 11/23/17 0352  WBC 26.9* 23.7*  HGB 17.2* 14.6  HCT 49.0 43.1  PLT 288 232   BMET Recent Labs    11/22/17 1329 11/23/17 0352  NA 136 139  K 3.6 3.5  CL 103 106  CO2 21* 22  GLUCOSE 131* 116*  BUN 18 19  CREATININE 0.87 1.03  CALCIUM 8.9 8.2*   LFT Recent Labs    11/23/17 0352  PROT 6.7  ALBUMIN 3.3*  AST 122*  ALT 434*  ALKPHOS 330*   BILITOT 3.8*   PT/INR Recent Labs    11/22/17 1743 11/22/17 1944  LABPROT 15.6* 15.6*  INR 1.25 1.25     Studies/Results: Mr 3d Recon At Scanner  Result Date: 11/22/2017 CLINICAL DATA:  Abdominal pain and elevated LFTs. Clinical concern for gallstone pancreatitis. EXAM: MRI ABDOMEN WITHOUT AND WITH CONTRAST (INCLUDING MRCP) TECHNIQUE: Multiplanar multisequence MR imaging of the abdomen was performed both before and after the administration of intravenous contrast. Heavily T2-weighted images of the biliary and pancreatic ducts were obtained, and three-dimensional MRCP images were rendered by post processing. CONTRAST:  18mL MULTIHANCE GADOBENATE DIMEGLUMINE 529 MG/ML IV SOLN COMPARISON:  None. FINDINGS: Lower chest: Right base atelectasis with tiny right pleural effusion Hepatobiliary: No focal abnormality in the liver parenchyma. Mild intra and extrahepatic biliary duct dilatation. Extrahepatic common duct measures 8 mm diameter. Common bile duct as it enters the head of the pancreas measures 7  mm diameter. 2 separate and potentially 3 filling defects are identified in the distal common bile duct just proximal to the ampulla. These are well demonstrated on coronal image 26 of series 9. The most distal measures about 3 mm and the most proximal measures about 6 mm (image 26 series 5). Numerous tiny gallstones are identified in the gallbladder lumen. Pancreas: There is diffuse edema in and around the head of pancreas no dilatation of the main pancreatic duct. No evidence for fluid collection in or adjacent to the pancreas. Spleen:  No splenomegaly. No focal mass lesion. Adrenals/Urinary Tract: No adrenal nodule or mass. Right kidney unremarkable. 7 mm T1 hypointense, T2 intermediate to hyperintense lesion is identified in the medial parenchyma of the interpolar left kidney. After IV contrast administration, there is a question of enhancing mural nodule. Stomach/Bowel: Stomach is nondistended. No  gastric wall thickening. No evidence of outlet obstruction. Duodenal wall thickening is evident. Possible duodenal diverticulum transverse segment. No small bowel or colonic dilatation within the visualized abdomen. Vascular/Lymphatic: No abdominal aortic aneurysm no abdominal lymphadenopathy Other:  No substantial intraperitoneal free fluid. Musculoskeletal: No abnormal marrow enhancement within the visualized bony anatomy. IMPRESSION: 1. Cholelithiasis with mild intra and extrahepatic biliary duct dilatation. 2, potentially 3, stones are seen in the distal common bile duct just proximal to the ampulla. 2. Edematous enlargement of the pancreatic head with edema in and around the pancreas and duodenum. No evidence for pancreatic pseudocyst. No dilatation of the main pancreatic duct. Electronically Signed   By: Kennith Center M.D.   On: 11/22/2017 19:31   Mr Abdomen Mrcp Vivien Rossetti Contast  Result Date: 11/22/2017 CLINICAL DATA:  Abdominal pain and elevated LFTs. Clinical concern for gallstone pancreatitis. EXAM: MRI ABDOMEN WITHOUT AND WITH CONTRAST (INCLUDING MRCP) TECHNIQUE: Multiplanar multisequence MR imaging of the abdomen was performed both before and after the administration of intravenous contrast. Heavily T2-weighted images of the biliary and pancreatic ducts were obtained, and three-dimensional MRCP images were rendered by post processing. CONTRAST:  18mL MULTIHANCE GADOBENATE DIMEGLUMINE 529 MG/ML IV SOLN COMPARISON:  None. FINDINGS: Lower chest: Right base atelectasis with tiny right pleural effusion Hepatobiliary: No focal abnormality in the liver parenchyma. Mild intra and extrahepatic biliary duct dilatation. Extrahepatic common duct measures 8 mm diameter. Common bile duct as it enters the head of the pancreas measures 7 mm diameter. 2 separate and potentially 3 filling defects are identified in the distal common bile duct just proximal to the ampulla. These are well demonstrated on coronal image 26 of  series 9. The most distal measures about 3 mm and the most proximal measures about 6 mm (image 26 series 5). Numerous tiny gallstones are identified in the gallbladder lumen. Pancreas: There is diffuse edema in and around the head of pancreas no dilatation of the main pancreatic duct. No evidence for fluid collection in or adjacent to the pancreas. Spleen:  No splenomegaly. No focal mass lesion. Adrenals/Urinary Tract: No adrenal nodule or mass. Right kidney unremarkable. 7 mm T1 hypointense, T2 intermediate to hyperintense lesion is identified in the medial parenchyma of the interpolar left kidney. After IV contrast administration, there is a question of enhancing mural nodule. Stomach/Bowel: Stomach is nondistended. No gastric wall thickening. No evidence of outlet obstruction. Duodenal wall thickening is evident. Possible duodenal diverticulum transverse segment. No small bowel or colonic dilatation within the visualized abdomen. Vascular/Lymphatic: No abdominal aortic aneurysm no abdominal lymphadenopathy Other:  No substantial intraperitoneal free fluid. Musculoskeletal: No abnormal marrow enhancement within the visualized  bony anatomy. IMPRESSION: 1. Cholelithiasis with mild intra and extrahepatic biliary duct dilatation. 2, potentially 3, stones are seen in the distal common bile duct just proximal to the ampulla. 2. Edematous enlargement of the pancreatic head with edema in and around the pancreas and duodenum. No evidence for pancreatic pseudocyst. No dilatation of the main pancreatic duct. Electronically Signed   By: Kennith CenterEric  Mansell M.D.   On: 11/22/2017 19:31    Impression/Plan: - Choledocholithiasis - Gallstone pancreatitis - Leukocytosis. Could be hemoconcentration from pancreatitis. Patient is afebrile - Abnormal LFTs. Secondary to # 1 . Trending down  Recommendations ------------------------- - Increase IV fluids to 250 mL/h for 3 L followed by 1 50 mL/h - Start full liquid diet. Nothing by  mouth past midnight - ERCP tentatively for tomorrow with Dr. Leone PayorGessner.  - Repeat blood work for tomorrow - He will also need cholecystectomy in near future. - Further plan based on ERCP findings.    LOS: 1 day   Kathi DerParag Chetan Mehring  MD, FACP 11/23/2017, 8:59 AM  Contact #  [phone]

## 2017-11-23 NOTE — Consult Note (Signed)
Referring Provider:  ER Dr. Charm Barges  Primary Care Physician:  Shelva Majestic, MD Primary Gastroenterologist:  Dr. Levora Angel  Reason for Consultation:  Gallstone pancreatitis  HPI: Christopher Blair is a 57 y.o. male  with past medical history of my [place] previous hyperthyroidism came into the hospital for further evaluation of abdominal pain. Severe and sharp epigastric pain which started 2-3 days ago. Radiation to back.. Associated with nausea and nonbloody vomiting. Complaining of constipation with no bowel movement for last 2-3 days. He denied any fever or chills. Denied similar symptoms in the past.  Was found was found to have elevated LFTs on initial evaluation. Subsequent MRI MRCP was ordered which showed pancreatitis with common bile duct  stones with intra-and extrahepatic biliary ductal dilatation. Past Medical History:  Diagnosis Date  . Hypothyroidism   . Myasthenia gravis 1994   in remission since 1996    Past Surgical History:  Procedure Laterality Date  . TOTAL THYMECTOMY  1994    Prior to Admission medications   Medication Sig Start Date End Date Taking? Authorizing Provider  aspirin 81 MG tablet Take 81 mg by mouth daily. Reported on 12/27/2015   Yes [provider]  ibuprofen (ADVIL,MOTRIN) 200 MG tablet Take 400 mg by mouth every 6 (six) hours as needed. Reported on 12/27/2015   Yes [provider]  levothyroxine (SYNTHROID, LEVOTHROID) 100 MCG tablet TAKE 1 TABLET BY MOUTH ONCE DAILY 06/01/17  Yes Shelva Majestic, MD    Scheduled Meds: . Chlorhexidine Gluconate Cloth  6 each Topical Daily  . enoxaparin (LOVENOX) injection  40 mg Subcutaneous QHS  . levothyroxine  50 mcg Intravenous Daily  . mupirocin ointment  1 application Nasal BID   Continuous Infusions: . sodium chloride 125 mL/hr at 11/23/17 0628  . sodium chloride    . piperacillin-tazobactam (ZOSYN)  IV Stopped (11/23/17 0831)   PRN Meds:.metoCLOPramide (REGLAN) injection,  morphine injection, ondansetron **OR** ondansetron (ZOFRAN) IV  Allergies as of 11/22/2017 - Review Complete 11/22/2017  Allergen Reaction Noted  . Mucinex [guaifenesin er] Other (See Comments) 03/07/2012    Family History  Problem Relation Age of Onset  . Cancer Mother 4       breast ca  . Heart disease Father        AMI-61, nonsmoker, died 62  . Migraines Son        and tics  . Anxiety disorder Daughter     Social History   Socioeconomic History  . Marital status: Married    Spouse name: Not on file  . Number of children: Not on file  . Years of education: Not on file  . Highest education level: Not on file  Social Needs  . Financial resource strain: Not on file  . Food insecurity - worry: Not on file  . Food insecurity - inability: Not on file  . Transportation needs - medical: Not on file  . Transportation needs - non-medical: Not on file  Occupational History  . Not on file  Tobacco Use  . Smoking status: Never Smoker  . Smokeless tobacco: Never Used  Substance and Sexual Activity  . Alcohol use: Yes    Comment: occasional  . Drug use: No  . Sexual activity: Not on file  Other Topics Concern  . Not on file  Social History Narrative   Married. 3 children. No grandkids. Oldest daughter getting married in June 2017.       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       Hobbies: essentially no hobbies as works frequently   Goes to [employer]     Review of Systems: Review of Systems  Constitutional: Negative for chills and fever.  HENT: Negative for hearing loss and tinnitus.   Eyes: Negative for blurred vision and double vision.  Respiratory: Negative for cough, hemoptysis and sputum production.   Cardiovascular: Negative for chest pain and palpitations.  Gastrointestinal: Positive for abdominal pain, constipation, heartburn, nausea and vomiting. Negative for blood in stool, diarrhea  and melena.  Genitourinary: Negative for dysuria and urgency.  Musculoskeletal: Positive for back pain. Negative for myalgias.  Skin: Negative for itching and rash.  Neurological: Negative for seizures and loss of consciousness.  Endo/Heme/Allergies: Does not bruise/bleed easily.  Psychiatric/Behavioral: Negative for hallucinations and suicidal ideas.    Physical Exam: Vital signs: Vitals:   11/22/17 2226 11/23/17 0431  BP: 140/89 133/88  Pulse: 98 (!) 104  Resp: 20 18  Temp: 99.4 F (37.4 C) 98.3 F (36.8 C)  SpO2: 95% 90%   Last BM Date: 11/20/17 Physical Exam  Constitutional: He is oriented to person, place, and time. He appears well-developed and well-nourished. No distress.  HENT:  Head: Normocephalic and atraumatic.  Mouth/Throat: Oropharynx is clear and moist. No oropharyngeal exudate.  Eyes: EOM are normal. Scleral icterus is present.  Neck: Normal range of motion. Neck supple.  Cardiovascular: Normal rate, regular rhythm and normal heart sounds.  Pulmonary/Chest: Effort normal and breath sounds normal. No respiratory distress.  Abdominal: Soft. Bowel sounds are normal. He exhibits no distension. There is tenderness. There is no rebound and no guarding.  Epigastric tenderness to palpation. No peritoneal signs  Musculoskeletal: Normal range of motion. He exhibits no edema.  Neurological: He is alert and oriented to person, place, and time.  Skin: Skin is warm. No erythema.  Psychiatric: He has a normal mood and affect. Thought content normal.  Vitals reviewed.   GI:  Lab Results: Recent Labs    11/22/17 1329 11/23/17 0352  WBC 26.9* 23.7*  HGB 17.2* 14.6  HCT 49.0 43.1  PLT 288 232   BMET Recent Labs    11/22/17 1329 11/23/17 0352  NA 136 139  K 3.6 3.5  CL 103 106  CO2 21* 22  GLUCOSE 131* 116*  BUN 18 19  CREATININE 0.87 1.03  CALCIUM 8.9 8.2*   LFT Recent Labs    11/23/17 0352  PROT 6.7  ALBUMIN 3.3*  AST 122*  ALT 434*  ALKPHOS 330*   BILITOT 3.8*   PT/INR Recent Labs    11/22/17 1743 11/22/17 1944  LABPROT 15.6* 15.6*  INR 1.25 1.25     Studies/Results: Mr 3d Recon At Scanner  Result Date: 11/22/2017 CLINICAL DATA:  Abdominal pain and elevated LFTs. Clinical concern for gallstone pancreatitis. EXAM: MRI ABDOMEN WITHOUT AND WITH CONTRAST (INCLUDING MRCP) TECHNIQUE: Multiplanar multisequence MR imaging of the abdomen was performed both before and after the administration of intravenous contrast. Heavily T2-weighted images of the biliary and pancreatic ducts were obtained, and three-dimensional MRCP images were rendered by post processing. CONTRAST:  18mL MULTIHANCE GADOBENATE DIMEGLUMINE 529 MG/ML IV SOLN COMPARISON:  None. FINDINGS: Lower chest: Right base atelectasis with tiny right pleural effusion Hepatobiliary: No focal abnormality in the liver parenchyma. Mild intra and extrahepatic biliary duct dilatation. Extrahepatic common duct measures 8 mm diameter. Common bile duct as it enters the head of the pancreas measures 7  mm diameter. 2 separate and potentially 3 filling defects are identified in the distal common bile duct just proximal to the ampulla. These are well demonstrated on coronal image 26 of series 9. The most distal measures about 3 mm and the most proximal measures about 6 mm (image 26 series 5). Numerous tiny gallstones are identified in the gallbladder lumen. Pancreas: There is diffuse edema in and around the head of pancreas no dilatation of the main pancreatic duct. No evidence for fluid collection in or adjacent to the pancreas. Spleen:  No splenomegaly. No focal mass lesion. Adrenals/Urinary Tract: No adrenal nodule or mass. Right kidney unremarkable. 7 mm T1 hypointense, T2 intermediate to hyperintense lesion is identified in the medial parenchyma of the interpolar left kidney. After IV contrast administration, there is a question of enhancing mural nodule. Stomach/Bowel: Stomach is nondistended. No  gastric wall thickening. No evidence of outlet obstruction. Duodenal wall thickening is evident. Possible duodenal diverticulum transverse segment. No small bowel or colonic dilatation within the visualized abdomen. Vascular/Lymphatic: No abdominal aortic aneurysm no abdominal lymphadenopathy Other:  No substantial intraperitoneal free fluid. Musculoskeletal: No abnormal marrow enhancement within the visualized bony anatomy. IMPRESSION: 1. Cholelithiasis with mild intra and extrahepatic biliary duct dilatation. 2, potentially 3, stones are seen in the distal common bile duct just proximal to the ampulla. 2. Edematous enlargement of the pancreatic head with edema in and around the pancreas and duodenum. No evidence for pancreatic pseudocyst. No dilatation of the main pancreatic duct. Electronically Signed   By: Kennith Center M.D.   On: 11/22/2017 19:31   Mr Abdomen Mrcp Vivien Rossetti Contast  Result Date: 11/22/2017 CLINICAL DATA:  Abdominal pain and elevated LFTs. Clinical concern for gallstone pancreatitis. EXAM: MRI ABDOMEN WITHOUT AND WITH CONTRAST (INCLUDING MRCP) TECHNIQUE: Multiplanar multisequence MR imaging of the abdomen was performed both before and after the administration of intravenous contrast. Heavily T2-weighted images of the biliary and pancreatic ducts were obtained, and three-dimensional MRCP images were rendered by post processing. CONTRAST:  18mL MULTIHANCE GADOBENATE DIMEGLUMINE 529 MG/ML IV SOLN COMPARISON:  None. FINDINGS: Lower chest: Right base atelectasis with tiny right pleural effusion Hepatobiliary: No focal abnormality in the liver parenchyma. Mild intra and extrahepatic biliary duct dilatation. Extrahepatic common duct measures 8 mm diameter. Common bile duct as it enters the head of the pancreas measures 7 mm diameter. 2 separate and potentially 3 filling defects are identified in the distal common bile duct just proximal to the ampulla. These are well demonstrated on coronal image 26 of  series 9. The most distal measures about 3 mm and the most proximal measures about 6 mm (image 26 series 5). Numerous tiny gallstones are identified in the gallbladder lumen. Pancreas: There is diffuse edema in and around the head of pancreas no dilatation of the main pancreatic duct. No evidence for fluid collection in or adjacent to the pancreas. Spleen:  No splenomegaly. No focal mass lesion. Adrenals/Urinary Tract: No adrenal nodule or mass. Right kidney unremarkable. 7 mm T1 hypointense, T2 intermediate to hyperintense lesion is identified in the medial parenchyma of the interpolar left kidney. After IV contrast administration, there is a question of enhancing mural nodule. Stomach/Bowel: Stomach is nondistended. No gastric wall thickening. No evidence of outlet obstruction. Duodenal wall thickening is evident. Possible duodenal diverticulum transverse segment. No small bowel or colonic dilatation within the visualized abdomen. Vascular/Lymphatic: No abdominal aortic aneurysm no abdominal lymphadenopathy Other:  No substantial intraperitoneal free fluid. Musculoskeletal: No abnormal marrow enhancement within the visualized  bony anatomy. IMPRESSION: 1. Cholelithiasis with mild intra and extrahepatic biliary duct dilatation. 2, potentially 3, stones are seen in the distal common bile duct just proximal to the ampulla. 2. Edematous enlargement of the pancreatic head with edema in and around the pancreas and duodenum. No evidence for pancreatic pseudocyst. No dilatation of the main pancreatic duct. Electronically Signed   By: Kennith CenterEric  Mansell M.D.   On: 11/22/2017 19:31    Impression/Plan: - Choledocholithiasis - Gallstone pancreatitis - Leukocytosis. Could be hemoconcentration from pancreatitis. Patient is afebrile - Abnormal LFTs. Secondary to # 1 . Trending down  Recommendations ------------------------- - Increase IV fluids to 250 mL/h for 3 L followed by 1 50 mL/h - Start full liquid diet. Nothing by  mouth past midnight - ERCP tentatively for tomorrow with Dr. Leone PayorGessner.  - Repeat blood work for tomorrow - He will also need cholecystectomy in near future. - Further plan based on ERCP findings.    LOS: 1 day   Kathi DerParag Fonnie Crookshanks  MD, FACP 11/23/2017, 8:59 AM  Contact #  [phone]

## 2017-11-24 ENCOUNTER — Encounter (HOSPITAL_COMMUNITY)
Admission: EM | Disposition: A | Discharge status: Home / Self Care | Payer: Self-pay | Source: Home / Self Care | Attending: Internal Medicine

## 2017-11-24 ENCOUNTER — Inpatient Hospital Stay (HOSPITAL_COMMUNITY): Payer: BLUE CROSS/BLUE SHIELD | Admitting: Certified Registered Nurse Anesthetist

## 2017-11-24 ENCOUNTER — Encounter (HOSPITAL_COMMUNITY): Payer: Self-pay | Admitting: Anesthesiology

## 2017-11-24 ENCOUNTER — Inpatient Hospital Stay (HOSPITAL_COMMUNITY): Payer: BLUE CROSS/BLUE SHIELD

## 2017-11-24 DIAGNOSIS — K8050 Calculus of bile duct without cholangitis or cholecystitis without obstruction: Secondary | ICD-10-CM

## 2017-11-24 DIAGNOSIS — K838 Other specified diseases of biliary tract: Secondary | ICD-10-CM

## 2017-11-24 HISTORY — PX: ERCP: SHX5425

## 2017-11-24 LAB — COMPREHENSIVE METABOLIC PANEL
ALK PHOS: 227 U/L — AB (ref 38–126)
ALT: 217 U/L — AB (ref 17–63)
ANION GAP: 10 (ref 5–15)
AST: 47 U/L — ABNORMAL HIGH (ref 15–41)
Albumin: 2.6 g/dL — ABNORMAL LOW (ref 3.5–5.0)
BILIRUBIN TOTAL: 2.7 mg/dL — AB (ref 0.3–1.2)
BUN: 14 mg/dL (ref 6–20)
CALCIUM: 8 mg/dL — AB (ref 8.9–10.3)
CO2: 22 mmol/L (ref 22–32)
CREATININE: 0.97 mg/dL (ref 0.61–1.24)
Chloride: 108 mmol/L (ref 101–111)
GFR calc non Af Amer: >60 mL/min (ref >60)
Glucose, Bld: 83 mg/dL (ref 65–99)
Potassium: 3.5 mmol/L (ref 3.5–5.1)
SODIUM: 140 mmol/L (ref 135–145)
TOTAL PROTEIN: 5.9 g/dL — AB (ref 6.5–8.1)

## 2017-11-24 LAB — LIPASE, BLOOD: LIPASE: 54 U/L — AB (ref 11–51)

## 2017-11-24 LAB — CBC
HCT: 38.2 % — ABNORMAL LOW (ref 39.0–52.0)
HEMOGLOBIN: 13 g/dL (ref 13.0–17.0)
MCH: 32.1 pg (ref 26.0–34.0)
MCHC: 34 g/dL (ref 30.0–36.0)
MCV: 94.3 fL (ref 78.0–100.0)
PLATELETS: 190 10*3/uL (ref 150–400)
RBC: 4.05 MIL/uL — AB (ref 4.22–5.81)
RDW: 13.8 % (ref 11.5–15.5)
WBC: 15.5 10*3/uL — ABNORMAL HIGH (ref 4.0–10.5)

## 2017-11-24 SURGERY — ERCP, WITH INTERVENTION IF INDICATED
Anesthesia: General

## 2017-11-24 MED ORDER — HYDROMORPHONE HCL 1 MG/ML IJ SOLN: 0.2500 mg | INTRAMUSCULAR | Status: DC | PRN

## 2017-11-24 MED ORDER — ONDANSETRON HCL 4 MG/2ML IJ SOLN
INTRAMUSCULAR | Status: DC | PRN
  Administered 2017-11-24: 4 mg via INTRAVENOUS

## 2017-11-24 MED ORDER — GLUCAGON HCL RDNA (DIAGNOSTIC) 1 MG IJ SOLR
INTRAMUSCULAR | Status: AC
  Filled 2017-11-24: qty 1

## 2017-11-24 MED ORDER — SODIUM CHLORIDE 0.9 % IV SOLN: INTRAVENOUS | Status: DC

## 2017-11-24 MED ORDER — PHENYLEPHRINE 40 MCG/ML (10ML) SYRINGE FOR IV PUSH (FOR BLOOD PRESSURE SUPPORT)
PREFILLED_SYRINGE | INTRAVENOUS | Status: DC | PRN
  Administered 2017-11-24: 80 ug via INTRAVENOUS

## 2017-11-24 MED ORDER — INDOMETHACIN 50 MG RE SUPP
RECTAL | Status: DC | PRN
  Administered 2017-11-24: 100 mg via RECTAL

## 2017-11-24 MED ORDER — SODIUM CHLORIDE 0.9 % IV SOLN
INTRAVENOUS | Status: DC
  Administered 2017-11-24 – 2017-11-25 (×4): via INTRAVENOUS

## 2017-11-24 MED ORDER — LACTATED RINGERS IV SOLN
INTRAVENOUS | Status: DC | PRN
  Administered 2017-11-24: 07:00:00 via INTRAVENOUS

## 2017-11-24 MED ORDER — HYDROMORPHONE HCL 1 MG/ML IJ SOLN
1.0000 mg | INTRAMUSCULAR | Status: DC | PRN
  Administered 2017-11-25 (×3): 1 mg via INTRAVENOUS
  Filled 2017-11-24 (×4): qty 1

## 2017-11-24 MED ORDER — ONDANSETRON HCL 4 MG/2ML IJ SOLN: 4.0000 mg | Freq: Once | INTRAMUSCULAR | Status: DC | PRN

## 2017-11-24 MED ORDER — INDOMETHACIN 50 MG RE SUPP
RECTAL | Status: AC
  Filled 2017-11-24: qty 2

## 2017-11-24 MED ORDER — PROPOFOL 10 MG/ML IV BOLUS
INTRAVENOUS | Status: AC
  Filled 2017-11-24: qty 20

## 2017-11-24 MED ORDER — SODIUM CHLORIDE 0.9 % IV SOLN
INTRAVENOUS | Status: DC | PRN
  Administered 2017-11-24: 30 mL

## 2017-11-24 MED ORDER — SUCCINYLCHOLINE CHLORIDE 200 MG/10ML IV SOSY
PREFILLED_SYRINGE | INTRAVENOUS | Status: DC | PRN
  Administered 2017-11-24: 140 mg via INTRAVENOUS

## 2017-11-24 MED ORDER — FENTANYL CITRATE (PF) 100 MCG/2ML IJ SOLN
INTRAMUSCULAR | Status: AC
  Filled 2017-11-24: qty 2

## 2017-11-24 MED ORDER — FENTANYL CITRATE (PF) 100 MCG/2ML IJ SOLN
INTRAMUSCULAR | Status: DC | PRN
  Administered 2017-11-24: 25 ug via INTRAVENOUS
  Administered 2017-11-24: 100 ug via INTRAVENOUS

## 2017-11-24 MED ORDER — PROPOFOL 10 MG/ML IV BOLUS
INTRAVENOUS | Status: DC | PRN
  Administered 2017-11-24 (×3): 20 mg via INTRAVENOUS
  Administered 2017-11-24: 180 mg via INTRAVENOUS

## 2017-11-24 MED ORDER — MEPERIDINE HCL 25 MG/ML IJ SOLN: 6.2500 mg | INTRAMUSCULAR | Status: DC | PRN

## 2017-11-24 NOTE — Interval H&P Note (Signed)
History and Physical Interval Note:  11/24/2017 7:25 AM  Christopher Blair  has presented today for surgery, with the diagnosis of CBD stones  The various methods of treatment have been discussed with the patient and family. After consideration of risks, benefits and other options for treatment, the patient has consented to  Procedure(s): ENDOSCOPIC RETROGRADE CHOLANGIOPANCREATOGRAPHY (ERCP) (N/A) as a surgical intervention .  The patient's history has been reviewed, patient examined, no change in status, stable for surgery.  I have reviewed the patient's chart and labs.  Questions were answered to the patient's satisfaction.     Stan Headarl Caliegh Middlekauff

## 2017-11-24 NOTE — Op Note (Addendum)
[facility] Patient Name: Christopher Blair Procedure Date: 11/24/2017 MRN: [id number] Attending MD: Iva Booparl E Stephany Poorman , MD Date of Birth: [birth year] CSN: [id number] Age: [age] Admit Type: Inpatient Procedure:                ERCP Indications:              Bile duct stone(s) Providers:                Iva Booparl E. Bellah Alia, MD, Dwain SarnaPatricia Ford, RN, Kandice RobinsonsGuillaume                            [place], Technician Referring MD:              Medicines:                General Anesthesia Complications:            No immediate complications. Estimated Blood Loss:     Estimated blood loss: none. Procedure:                Pre-Anesthesia Assessment:                           - Prior to the procedure, a History and Physical                            was performed, and patient medications and                            allergies were reviewed. The patient's tolerance of                            previous anesthesia was also reviewed. The risks                            and benefits of the procedure and the sedation                            options and risks were discussed with the patient.                            All questions were answered, and informed consent                            was obtained. Prior Anticoagulants: The patient                            last took Lovenox (enoxaparin) 1 day prior to the                            procedure. ASA Grade Assessment: III - A patient                            with severe systemic disease. After reviewing the  risks and benefits, the patient was deemed in                            satisfactory condition to undergo the procedure.                           After obtaining informed consent, the scope was                            passed under direct vision. Throughout the                            procedure, the patient's blood pressure, pulse, and                            oxygen saturations were monitored  continuously. The                            ZO-[id number] ([id number]) scope was introduced through                            the mouth, and used to inject contrast into and                            used to inject contrast into the bile duct. Scope In: Scope Out: Findings:      The scout film was normal. Esophagus not seen well except distal aspect       - GE junction did not open well at first but with gentle pressure the       scope passed - no trauma and no stricture. Stomach dilated but grossly       normal. Dudoenal bulb normal - but diffuse edema of medial wall of D2.       Papilla found - NL but surrounde by edema. Bile draining. Sphincterotome       pased and CBD entered directly via wire-guided approach. Pancreatic duct       entered w/ wire x 1 first - very short entry. Contrast injection showed       diffusely dilated biliary tree - mild, gallbladder diod not fill and I       did not see stones. Given overall situation w/ + MRCP and the edema I       elected to place a stent as I did not think sphincterotomy would go well       due to the surrounding edema. 5 cm 10 Fr plastic biliary stent placed,       dark green bile drained. Impression:               - The entire biliary tree was mildly dilated. No                            stones seen. 10 Fr 5 cm plastic biliary stent                            placed to ensure drainage.  Bili dropping so stone(s) may have passed.                           Duodenal edema from pancreatitis which might be his                            main source of pain now. I changed to Dilaudid 1 mg                            q 3 hrs Moderate Sedation:      N/A- Per Anesthesia Care Recommendation:           - Return patient to hospital ward for ongoing care.                           - Continue treatment of pancreatitis.                           Eventual cholecystectomy.                           IOC at time of cholecystectomy  and either stent                            pull or repeat ERCP and stone removal depending                            upon what that shows. he will f/u with [facility] Procedure Code(s):        --- Professional ---                           [phone], Endoscopic retrograde                            cholangiopancreatography (ERCP); with placement of                            endoscopic stent into biliary or pancreatic duct,                            including pre- and post-dilation and guide wire                            passage, when performed, including sphincterotomy,                            when performed, each stent Diagnosis Code(s):        --- Professional ---                           K80.50, Calculus of bile duct without cholangitis                            or cholecystitis without obstruction  K83.8, Other specified diseases of biliary tract CPT copyright 2016 American Medical Association. All rights reserved. The codes documented in this report are preliminary and upon coder review may  be revised to meet current compliance requirements. Iva Boop, MD 11/24/2017 8:44:04 AM This report has been signed electronically. Number of Addenda: 0

## 2017-11-24 NOTE — Transfer of Care (Signed)
Immediate Anesthesia Transfer of Care Note  Patient: Christopher AsalJoseph R Raneri  Procedure(s) Performed: Procedure(s): ENDOSCOPIC RETROGRADE CHOLANGIOPANCREATOGRAPHY (ERCP) (N/A)  Patient Location: PACU  Anesthesia Type:General  Level of Consciousness:  sedated, patient cooperative and responds to stimulation  Airway & Oxygen Therapy:Patient Spontanous Breathing and Patient connected to face mask oxgen  Post-op Assessment:  Report given to PACU RN and Post -op Vital signs reviewed and stable  Post vital signs:  Reviewed and stable  Last Vitals:  Vitals:   11/24/17 0403 11/24/17 0721  BP: 128/83 135/76  Pulse: 88 77  Resp: 18 15  Temp: 36.9 C 37.1 C  SpO2: 92% (!) 87%    Complications: No apparent anesthesia complications

## 2017-11-24 NOTE — Anesthesia Preprocedure Evaluation (Signed)
Anesthesia Evaluation  Patient identified by MRN, date of birth, ID band Patient awake    Reviewed: Allergy & Precautions, NPO status , Patient's Chart, lab work & pertinent test results  Airway Mallampati: II  TM Distance: >3 FB Neck ROM: Full    Dental   Pulmonary    Pulmonary exam normal        Cardiovascular Normal cardiovascular exam     Neuro/Psych H/O Myesthenia Gravis    GI/Hepatic   Endo/Other    Renal/GU      Musculoskeletal   Abdominal   Peds  Hematology   Anesthesia Other Findings   Reproductive/Obstetrics                             Anesthesia Physical Anesthesia Plan  ASA: II  Anesthesia Plan: General   Post-op Pain Management:    Induction: Intravenous  PONV Risk Score and Plan: 2  Airway Management Planned: Oral ETT  Additional Equipment:   Intra-op Plan:   Post-operative Plan: Extubation in OR  Informed Consent: I have reviewed the patients History and Physical, chart, labs and discussed the procedure including the risks, benefits and alternatives for the proposed anesthesia with the patient or authorized representative who has indicated his/her understanding and acceptance.     Plan Discussed with: CRNA and Surgeon  Anesthesia Plan Comments:         Anesthesia Quick Evaluation

## 2017-11-24 NOTE — Progress Notes (Signed)
Patient ID: Christopher Blair, male   DOB: 1961-07-31, 57 y.o.   MRN: [id number]    PROGRESS NOTE  Christopher Blair  WUJ:[id number] DOB: 1961-07-31 DOA: 11/22/2017  PCP: Shelva MajesticHunter, Stephen O, MD   Brief Narrative:   History of present illness: Patient is 57 year old male with known history of myasthenia gravis that has been stable and in remission for 20 years, presented to [facility] emergency department with main concern of 3 days duration of diffuse abdominal pain, sharp and intermittent, stabbing like at times, 10/10 in severity, radiating to the back, associated with nausea and nonbloody vomiting, unable to keep anything down. Patient also reports associated dyspnea, pain with deep breaths. Patient denies fevers or chills, no recent sick contacts or exposures, no similar events in the past. Patient denies chest pain, no specific urinary concerns.  In emergency department, patient noted to be in mild distress due to pain, vital signs notable for temperature 98.7, heart rate up to 123, blood pressure 127/98. Blood work notable for WBC 26.9, hemoglobin 17.2, ALT 259, AST 671 lipase 1100. Concern for gallstone pancreatitis. TRH asked to admit for further evaluation. ER doctor consulted with GI on call, recommendation is for stat MRCP, hold off on antibiotics.   Assessment & Plan:   Assessment/Plan Active Problems: Sepsis, Acute diffuse abdominal pain, transaminitis, pancreatitis - per MRCP, cholelithiasis with mild intra-and extrahepatic biliary duct dilatation - Edematous enlargement of the pancreatic head also noted - ERCP done, stent placed, no stones noted and likely because pt already passed stones - LFTs overall trending down, lipase trending down - Continue analgesia and antiemetics as needed - pt wants to have diet advanced, will try soft diet today and pt made aware if he is not tolerating, will need to go back to CLD - Continue empiric antibiotic day #3 for now  Elevated  hemoglobin - From dehydration - resolved, Hg overall stable   Hypothyroidism - will plan to change to PO if pt able to toelrate soft diet   DVT prophylaxis: Lovenox SQ Code Status: Full code Family Communication: pt and wife at bedside  Disposition Plan: To be determined  Consultants:   GI   Procedures:   MRCP 02/14  Antimicrobials:   Zosyn 02/14 -->  Subjective: Pt reports feeling better. Wants to have diet advanced.    Objective: Vitals:   11/23/17 1313 11/23/17 2116 11/24/17 0403 11/24/17 0721  BP: 126/71 117/73 128/83 135/76  Pulse: (!) 103 95 88 77  Resp:  18 18 15   Temp: 99.7 F (37.6 C) 98.2 F (36.8 C) 98.4 F (36.9 C) 98.7 F (37.1 C)  TempSrc: Oral Oral Oral Oral  SpO2: 92% 92% 92% (!) 87%  Weight:   93.4 kg (205 lb 14.6 oz)   Height:        Intake/Output Summary (Last 24 hours) at 11/24/2017 0909 Last data filed at 11/24/2017 0906 Gross per 24 hour  Intake 2716.25 ml  Output 450 ml  Net 2266.25 ml   Filed Weights   11/22/17 2226 11/24/17 0403  Weight: 89.5 kg (197 lb 6.4 oz) 93.4 kg (205 lb 14.6 oz)   Physical Exam  Constitutional: Appears well-developed and well-nourished. No distress.  Abdominal: Soft. BS +,  no distension, tenderness, rebound or guarding.  Musculoskeletal: Normal range of motion. No edema and no tenderness.  Neuro: Alert. Normal reflexes, muscle tone coordination.  Skin: Skin is warm and dry. No rash noted.  Psychiatric: Normal mood and affect. Behavior, judgment, thought content normal.  Data Reviewed: I have personally reviewed following labs and imaging studies  CBC: Recent Labs  Lab 11/22/17 1329 11/23/17 0352 11/24/17 0415  WBC 26.9* 23.7* 15.5*  HGB 17.2* 14.6 13.0  HCT 49.0 43.1 38.2*  MCV 90.9 92.7 94.3  PLT 288 232 190   Basic Metabolic Panel: Recent Labs  Lab 11/22/17 1329 11/23/17 0352 11/24/17 0415  NA 136 139 140  K 3.6 3.5 3.5  CL 103 106 108  CO2 21* 22 22  GLUCOSE 131* 116* 83  BUN  18 19 14   CREATININE 0.87 1.03 0.97  CALCIUM 8.9 8.2* 8.0*   Liver Function Tests: Recent Labs  Lab 11/22/17 1329 11/23/17 0352 11/24/17 0415  AST 259* 122* 47*  ALT 677* 434* 217*  ALKPHOS 471* 330* 227*  BILITOT 8.8* 3.8* 2.7*  PROT 7.8 6.7 5.9*  ALBUMIN 4.0 3.3* 2.6*   Recent Labs  Lab 11/22/17 1329 11/23/17 0352 11/24/17 0415  LIPASE 1,167* 136* 54*   Coagulation Profile: Recent Labs  Lab 11/22/17 1743 11/22/17 1944  INR 1.25 1.25   Thyroid Function Tests: Recent Labs    11/23/17 0352  TSH 2.073   Urine analysis:    Component Value Date/Time   COLORURINE AMBER (A) 11/22/2017 1354   APPEARANCEUR HAZY (A) 11/22/2017 1354   LABSPEC 1.025 11/22/2017 1354   PHURINE 5.0 11/22/2017 1354   GLUCOSEU NEGATIVE 11/22/2017 1354   GLUCOSEU NEGATIVE 05/27/2009 0727   HGBUR MODERATE (A) 11/22/2017 1354   BILIRUBINUR MODERATE (A) 11/22/2017 1354   BILIRUBINUR n 10/27/2015 0931   KETONESUR 5 (A) 11/22/2017 1354   PROTEINUR 30 (A) 11/22/2017 1354   UROBILINOGEN 0.2 10/27/2015 0931   UROBILINOGEN 0.2 05/27/2009 0727   NITRITE NEGATIVE 11/22/2017 1354   LEUKOCYTESUR NEGATIVE 11/22/2017 1354    Recent Results (from the past 240 hour(s))  Culture, blood (x 2)     Status: None (Preliminary result)   Collection Time: 11/22/17  7:35 PM  Result Value Ref Range Status   Specimen Description   Final    BLOOD LEFT ANTECUBITAL Performed at [facility], [address], [place], [place] [address]    Special Requests   Final    BOTTLES DRAWN AEROBIC AND ANAEROBIC Blood Culture adequate volume Performed at [facility], [address], [place], [place] [address]    Culture   Final    NO GROWTH < 24 HOURS Performed at [facility], [address], [place], [place] [address]    Report Status PENDING  Incomplete  Culture, blood (x 2)     Status: None (Preliminary result)   Collection Time: 11/22/17  7:44 PM  Result Value Ref Range  Status   Specimen Description   Final    BLOOD LEFT HAND Performed at [facility], [address] [place], [place] [address]    Special Requests   Final    BOTTLES DRAWN AEROBIC AND ANAEROBIC Blood Culture adequate volume Performed at [facility], [address], [place], [place] [address]    Culture   Final    NO GROWTH < 24 HOURS Performed at [facility], [address], [place], [place] [address]    Report Status PENDING  Incomplete  Surgical PCR screen     Status: Abnormal   Collection Time: 11/22/17 10:56 PM  Result Value Ref Range Status   MRSA, PCR NEGATIVE NEGATIVE Final   Staphylococcus aureus POSITIVE (A) NEGATIVE Final    Comment: (  NOTE) The Xpert SA Assay (FDA approved for NASAL specimens in patients 48 years of age and older), is one component of a comprehensive surveillance program. It is not intended to diagnose infection nor to guide or monitor treatment. Performed at [facility], [address] [place], [place] [address]      Radiology Studies: Mr Abdomen Mrcp Vivien Rossetti Contast Result Date: 11/22/2017 1. Cholelithiasis with mild intra and extrahepatic biliary duct dilatation. 2, potentially 3, stones are seen in the distal common bile duct just proximal to the ampulla. 2. Edematous enlargement of the pancreatic head with edema in and around the pancreas and duodenum. No evidence for pancreatic pseudocyst. No dilatation of the main pancreatic duct.   Scheduled Meds: . [MAR Hold] Chlorhexidine Gluconate Cloth  6 each Topical Daily  . [MAR Hold] enoxaparin (LOVENOX) injection  40 mg Subcutaneous QHS  . [MAR Hold] levothyroxine  50 mcg Intravenous Daily  . [MAR Hold] mupirocin ointment  1 application Nasal BID   Continuous Infusions: . sodium chloride    . sodium chloride    . sodium chloride 150 mL/hr at 11/24/17 0211  . [MAR Hold] piperacillin-tazobactam (ZOSYN)  IV 3.375 g (11/24/17 0332)      LOS: 2 days   Time spent: 25 minutes   Debbora Presto, MD [facility] Pager [phone]  If 7PM-7AM, please contact night-coverage [email or web address] Password [email or web address] 11/24/2017, 9:09 AM

## 2017-11-24 NOTE — Anesthesia Postprocedure Evaluation (Signed)
Anesthesia Post Note  Patient: Christopher Blair  Procedure(s) Performed: ENDOSCOPIC RETROGRADE CHOLANGIOPANCREATOGRAPHY (ERCP) (N/A )     Patient location during evaluation: PACU Anesthesia Type: General Level of consciousness: awake and alert Pain management: pain level controlled Vital Signs Assessment: post-procedure vital signs reviewed and stable Respiratory status: spontaneous breathing, nonlabored ventilation, respiratory function stable and patient connected to nasal cannula oxygen Cardiovascular status: blood pressure returned to baseline and stable Postop Assessment: no apparent nausea or vomiting Anesthetic complications: no    Last Vitals:  Vitals:   11/24/17 0945 11/24/17 1000  BP: 125/80 130/77  Pulse: 76 72  Resp: 16 18  Temp: 36.9 C 36.8 C  SpO2: 95% 95%    Last Pain:  Vitals:   11/24/17 0721  TempSrc: Oral  PainSc:                  [name] DAVID

## 2017-11-24 NOTE — Anesthesia Procedure Notes (Signed)
Procedure Name: Intubation Date/Time: 11/24/2017 7:41 AM Performed by: Anne Fu, CRNA Pre-anesthesia Checklist: Patient identified, Emergency Drugs available, Suction available, Patient being monitored and Timeout performed Patient Re-evaluated:Patient Re-evaluated prior to induction Oxygen Delivery Method: Circle system utilized Preoxygenation: Pre-oxygenation with 100% oxygen Induction Type: IV induction Ventilation: Mask ventilation without difficulty Laryngoscope Size: Mac and 4 Grade View: Grade I Tube type: Oral Tube size: 7.5 mm Number of attempts: 1 Airway Equipment and Method: Stylet Placement Confirmation: ETT inserted through vocal cords under direct vision,  positive ETCO2 and breath sounds checked- equal and bilateral Secured at: 19 cm Tube secured with: Tape Dental Injury: Teeth and Oropharynx as per pre-operative assessment

## 2017-11-25 ENCOUNTER — Encounter (HOSPITAL_COMMUNITY): Payer: Self-pay

## 2017-11-25 ENCOUNTER — Inpatient Hospital Stay (HOSPITAL_COMMUNITY): Payer: BLUE CROSS/BLUE SHIELD

## 2017-11-25 DIAGNOSIS — R1084 Generalized abdominal pain: Secondary | ICD-10-CM

## 2017-11-25 LAB — COMPREHENSIVE METABOLIC PANEL
ALBUMIN: 2.3 g/dL — AB (ref 3.5–5.0)
ALT: 135 U/L — ABNORMAL HIGH (ref 17–63)
ANION GAP: 6 (ref 5–15)
AST: 30 U/L (ref 15–41)
Alkaline Phosphatase: 191 U/L — ABNORMAL HIGH (ref 38–126)
BUN: 16 mg/dL (ref 6–20)
CHLORIDE: 110 mmol/L (ref 101–111)
CO2: 26 mmol/L (ref 22–32)
Calcium: 7.8 mg/dL — ABNORMAL LOW (ref 8.9–10.3)
Creatinine, Ser: 0.81 mg/dL (ref 0.61–1.24)
GFR calc Af Amer: >60 mL/min (ref >60)
GFR calc non Af Amer: >60 mL/min (ref >60)
Glucose, Bld: 110 mg/dL — ABNORMAL HIGH (ref 65–99)
POTASSIUM: 3.8 mmol/L (ref 3.5–5.1)
SODIUM: 142 mmol/L (ref 135–145)
Total Bilirubin: 1.7 mg/dL — ABNORMAL HIGH (ref 0.3–1.2)
Total Protein: 5.5 g/dL — ABNORMAL LOW (ref 6.5–8.1)

## 2017-11-25 LAB — CBC
HEMATOCRIT: 37.5 % — AB (ref 39.0–52.0)
HEMOGLOBIN: 12.6 g/dL — AB (ref 13.0–17.0)
MCH: 31.6 pg (ref 26.0–34.0)
MCHC: 33.6 g/dL (ref 30.0–36.0)
MCV: 94 fL (ref 78.0–100.0)
Platelets: 207 10*3/uL (ref 150–400)
RBC: 3.99 MIL/uL — ABNORMAL LOW (ref 4.22–5.81)
RDW: 13.4 % (ref 11.5–15.5)
WBC: 12.2 10*3/uL — ABNORMAL HIGH (ref 4.0–10.5)

## 2017-11-25 LAB — LIPASE, BLOOD: Lipase: 29 U/L (ref 11–51)

## 2017-11-25 LAB — GLUCOSE, CAPILLARY: Glucose-Capillary: 116 mg/dL — ABNORMAL HIGH (ref 65–99)

## 2017-11-25 MED ORDER — LACTATED RINGERS IV SOLN: INTRAVENOUS | Status: DC

## 2017-11-25 MED ORDER — OXYCODONE HCL 5 MG PO TABS: 5.0000 mg | ORAL_TABLET | ORAL | 0 refills | Status: DC | PRN

## 2017-11-25 NOTE — Progress Notes (Addendum)
[facility] Progress Note  Christopher AsalJoseph R Blair 57 y.o. [birth year]  CC:  Gallstone pancreatitis, abnormal LFTs   Subjective: Status post ERCP yesterday. No choledocholithiasis but significant edema in the duodenum. One plastic stent was placed. Significant improvement in abdominal pain immediately after ERCP but overnight started having worsening abdominal pain. Remains afebrile. Having bowel movement. Denied any blood in the stool.  ROS : Negative for chest pain shortness of breath. Positive for fatigue and weakness.   Objective: Vital signs in last 24 hours: Vitals:   11/24/17 1419 11/24/17 2102  BP: 119/76 123/80  Pulse: 70 78  Resp: 20 20  Temp: 97.9 F (36.6 C) 98.6 F (37 C)  SpO2: 99% 93%    Physical Exam:  General:  Alert, cooperative, no distress, appears stated age  Head:  Normocephalic, without obvious abnormality, atraumatic  Eyes:  , EOM's intact,   Lungs:   Clear to auscultation bilaterally, respirations unlabored  Heart:  Regular rate and rhythm, S1, S2 normal  Abdomen:   Epigastric as well as bilateral upper quadrant tenderness to palpation. Abdomen is soft. Not distended. Bowel sounds present. No peritoneal signs.   Extremities: Extremities normal, atraumatic, no  edema  Pulses: 2+ and symmetric    Lab Results: Recent Labs    11/24/17 0415 11/25/17 0424  NA 140 142  K 3.5 3.8  CL 108 110  CO2 22 26  GLUCOSE 83 110*  BUN 14 16  CREATININE 0.97 0.81  CALCIUM 8.0* 7.8*   Recent Labs    11/24/17 0415 11/25/17 0424  AST 47* 30  ALT 217* 135*  ALKPHOS 227* 191*  BILITOT 2.7* 1.7*  PROT 5.9* 5.5*  ALBUMIN 2.6* 2.3*   Recent Labs    11/24/17 0415 11/25/17 0424  WBC 15.5* 12.2*  HGB 13.0 12.6*  HCT 38.2* 37.5*  MCV 94.3 94.0  PLT 190 207   Recent Labs    11/22/17 1743 11/22/17 1944  LABPROT 15.6* 15.6*  INR 1.25 1.25      Assessment/Plan: - Gallstone pancreatitis. MRCP showed possible CBD stones. ERCP yesterday showed  no CBD stones but diffusely dilated CBD and  significant edema in the duodenum from underlying pancreatitis. Sphincterotomy was not performed.  plastic stent was placed  - Abnormal LFTs. Secondary to # 1 . Trending down  Recommendations ------------------------- -  patient's LFTs improving, Lipase normal this morning. Leukocytosis also trending down but patient is complaining of worsening abdominal pain overnight. - Abdominal x-ray today. - Patient will need cholecystectomy eventually. D/W Dr. Ezzard StandingNewman from surgical team. - Will consider removal of plastic stent versus repeat ERCP depending on overall clinical picture  - [facility] will follow tomorrow.      Kathi DerParag Tandi Hanko MD, FACP 11/25/2017, 8:06 AM  Contact #  [phone]

## 2017-11-25 NOTE — Progress Notes (Signed)
Patient ID: Christopher Blair, male   DOB: 03-21-1961, 57 y.o.   MRN: [id number]    PROGRESS NOTE  Christopher Blair  Blair DOB: 03-21-1961 DOA: 11/22/2017  PCP: Shelva MajesticHunter, Christopher O, MD   Brief Narrative:   History of present illness: Patient is 57 year old male with known history of myasthenia gravis that has been stable and in remission for 20 years, [facility] to [facility] department with main concern of 3 days duration of diffuse abdominal pain, sharp and intermittent, stabbing like at times, 10/10 in severity, radiating to the back, associated with nausea and nonbloody vomiting, unable to keep anything down. Patient also reports associated dyspnea, pain with deep breaths. Patient denies fevers or chills, no recent sick contacts or exposures, no similar events in the past. Patient denies chest pain, no specific urinary concerns.  In emergency department, patient noted to be in mild distress due to pain, vital signs notable for temperature 98.7, heart rate up to 123, blood pressure 127/98. Blood work notable for WBC 26.9, hemoglobin 17.2, ALT 259, AST 671 lipase 1100. Concern for gallstone pancreatitis. TRH asked to admit for further evaluation. ER doctor consulted with GI on call, recommendation is for stat MRCP, hold off on antibiotics.   Assessment & Plan:   Assessment/Plan Active Problems: Sepsis, Acute diffuse abdominal pain, transaminitis, pancreatitis - per MRCP, cholelithiasis with mild intra-and extrahepatic biliary duct dilatation - Edematous enlargement of the pancreatic head also noted - ERCP done 02/16, stent placed, no stones noted and likely because pt already passed stones - LFT and lipase trending down but pt with more pain this AM, > post ERCP flare - change to CLD for now and montior - Continue empiric antibiotic day #3 for now - appreciate surgery and GI team assistance   Elevated hemoglobin - From dehydration - resolved   Hypothyroidism -  will plan to change to PO if pt able to toelrate soft diet   DVT prophylaxis: Lovenox SQ Code Status: Full code Family Communication: pt and wife at bedside  Disposition Plan: To be determined  Consultants:   GI   Surgery   Procedures:   MRCP 02/14  Antimicrobials:   Zosyn 02/14 -->  Subjective: Pt reports more abd pain, no nausea or vomiting.   Objective: Vitals:   11/24/17 0945 11/24/17 1000 11/24/17 1419 11/24/17 2102  BP: 125/80 130/77 119/76 123/80  Pulse: 76 72 70 78  Resp: 16 18 20 20   Temp: 98.5 F (36.9 C) 98.2 F (36.8 C) 97.9 F (36.6 C) 98.6 F (37 C)  TempSrc:   Oral Oral  SpO2: 95% 95% 99% 93%  Weight:      Height:        Intake/Output Summary (Last 24 hours) at 11/25/2017 0856 Last data filed at 11/25/2017 0600 Gross per 24 hour  Intake 5270 ml  Output -  Net 5270 ml   Filed Weights   11/22/17 2226 11/24/17 0403  Weight: 89.5 kg (197 lb 6.4 oz) 93.4 kg (205 lb 14.6 oz)   Physical Exam  Constitutional: Appears well-developed and well-nourished. No distress.  CVS: RRR, S1/S2 +, no murmurs, no gallops, no carotid bruit.  Pulmonary: Effort and breath sounds normal, no stridor, rhonchi, wheezes, rales.  Abdominal: Soft. BS +,  Slightly distended, no guarding and no rebound  Musculoskeletal: Normal range of motion. No edema and no tenderness.  Neuro: Alert. Normal reflexes, muscle tone coordination. No cranial nerve deficit. Psychiatric: Normal mood and affect. Behavior, judgment, thought content normal.  Data Reviewed: I have personally reviewed following labs and imaging studies  CBC: Recent Labs  Lab 11/22/17 1329 11/23/17 0352 11/24/17 0415 11/25/17 0424  WBC 26.9* 23.7* 15.5* 12.2*  HGB 17.2* 14.6 13.0 12.6*  HCT 49.0 43.1 38.2* 37.5*  MCV 90.9 92.7 94.3 94.0  PLT 288 232 190 207   Basic Metabolic Panel: Recent Labs  Lab 11/22/17 1329 11/23/17 0352 11/24/17 0415 11/25/17 0424  NA 136 139 140 142  K 3.6 3.5 3.5 3.8  CL  103 106 108 110  CO2 21* 22 22 26   GLUCOSE 131* 116* 83 110*  BUN 18 19 14 16   CREATININE 0.87 1.03 0.97 0.81  CALCIUM 8.9 8.2* 8.0* 7.8*   Liver Function Tests: Recent Labs  Lab 11/22/17 1329 11/23/17 0352 11/24/17 0415 11/25/17 0424  AST 259* 122* 47* 30  ALT 677* 434* 217* 135*  ALKPHOS 471* 330* 227* 191*  BILITOT 8.8* 3.8* 2.7* 1.7*  PROT 7.8 6.7 5.9* 5.5*  ALBUMIN 4.0 3.3* 2.6* 2.3*   Recent Labs  Lab 11/22/17 1329 11/23/17 0352 11/24/17 0415 11/25/17 0424  LIPASE 1,167* 136* 54* 29   Coagulation Profile: Recent Labs  Lab 11/22/17 1743 11/22/17 1944  INR 1.25 1.25   Thyroid Function Tests: Recent Labs    11/23/17 0352  TSH 2.073   Urine analysis:    Component Value Date/Time   COLORURINE AMBER (A) 11/22/2017 1354   APPEARANCEUR HAZY (A) 11/22/2017 1354   LABSPEC 1.025 11/22/2017 1354   PHURINE 5.0 11/22/2017 1354   GLUCOSEU NEGATIVE 11/22/2017 1354   GLUCOSEU NEGATIVE 05/27/2009 0727   HGBUR MODERATE (A) 11/22/2017 1354   BILIRUBINUR MODERATE (A) 11/22/2017 1354   BILIRUBINUR n 10/27/2015 0931   KETONESUR 5 (A) 11/22/2017 1354   PROTEINUR 30 (A) 11/22/2017 1354   UROBILINOGEN 0.2 10/27/2015 0931   UROBILINOGEN 0.2 05/27/2009 0727   NITRITE NEGATIVE 11/22/2017 1354   LEUKOCYTESUR NEGATIVE 11/22/2017 1354    Recent Results (from the past 240 hour(s))  Culture, blood (x 2)     Status: None (Preliminary result)   Collection Time: 11/22/17  7:35 PM  Result Value Ref Range Status   Specimen Description   Final    BLOOD LEFT ANTECUBITAL Performed at [facility], [address] [place], [place] [address]    Special Requests   Final    BOTTLES DRAWN AEROBIC AND ANAEROBIC Blood Culture adequate volume Performed at [facility], [address], [place], [place] [address]    Culture   Final    NO GROWTH 2 DAYS Performed at [facility], [address] [place], [place] [address]    Report Status  PENDING  Incomplete  Culture, blood (x 2)     Status: None (Preliminary result)   Collection Time: 11/22/17  7:44 PM  Result Value Ref Range Status   Specimen Description   Final    BLOOD LEFT HAND Performed at [facility], [address], [place], [place] [address]    Special Requests   Final    BOTTLES DRAWN AEROBIC AND ANAEROBIC Blood Culture adequate volume Performed at [facility], [address], [place], [place] [address]    Culture   Final    NO GROWTH 2 DAYS Performed at [facility], [address] [place], [place] [address]    Report Status PENDING  Incomplete  Surgical PCR screen     Status: Abnormal   Collection Time: 11/22/17 10:56  PM  Result Value Ref Range Status   MRSA, PCR NEGATIVE NEGATIVE Final   Staphylococcus aureus POSITIVE (A) NEGATIVE Final    Comment: (NOTE) The Xpert SA Assay (FDA approved for NASAL specimens in patients 80 years of age and older), is one component of a comprehensive surveillance program. It is not intended to diagnose infection nor to guide or monitor treatment. Performed at [facility], [address] [place], [place] [address]      Radiology Studies: Mr Abdomen Mrcp Vivien Rossetti Contast Result Date: 11/22/2017 1. Cholelithiasis with mild intra and extrahepatic biliary duct dilatation. 2, potentially 3, stones are seen in the distal common bile duct just proximal to the ampulla. 2. Edematous enlargement of the pancreatic head with edema in and around the pancreas and duodenum. No evidence for pancreatic pseudocyst. No dilatation of the main pancreatic duct.   Scheduled Meds: . Chlorhexidine Gluconate Cloth  6 each Topical Daily  . enoxaparin (LOVENOX) injection  40 mg Subcutaneous QHS  . levothyroxine  50 mcg Intravenous Daily  . mupirocin ointment  1 application Nasal BID   Continuous Infusions: . sodium chloride 150 mL/hr at 11/25/17 0820  . lactated ringers    .  piperacillin-tazobactam (ZOSYN)  IV Stopped (11/25/17 0818)     LOS: 3 days   Time spent: 25 minutes   Debbora Presto, MD [facility] Pager [phone]  If 7PM-7AM, please contact night-coverage [email or web address] Password [place] 11/25/2017, 8:56 AM

## 2017-11-25 NOTE — Consult Note (Signed)
Re:   Christopher Blair DOB:   1961-02-04 MRN:   [id number]  Chief Complaint Abdominal pain  ASSESEMENT AND PLAN: 1.  Gall stone pancreatitis  With probably flare up from ERCP yesterday  Will back diet to clear liquids for now, agree with IVF orders.  Will need cholecystectomy when pancreas has calmed back down.  Probably middle of the week.  2.  Elevated LFT  Presumably secondary to CBD stone/pancreatitis  3.  History of myasthenia gravis   Had thymectomy at [facility] in 1994 - has done well since then  Chief Complaint  Patient presents with  . Abdominal Pain   PHYSICIAN REQUESTING CONSULTATION: Shelva Majestic, MD  HISTORY OF PRESENT ILLNESS: Christopher Blair is a 57 y.o. (DOB: 03-Mar-1961)  white male whose primary care physician is Shelva Majestic, MD.  His wife, Christopher Blair, and son, Christopher Blair, are in the room.   The patient had had a couple of bouts of abdominal pain last year.  One was in July and one was in August, but these got better without medical intervention.  Then last Monday, February 11, he had increased abdominal pain which became bad enough for him to seek medical attention at [facility].  He has no history of stomach, liver, colon disease.  He underwent a negative colonoscopy by Dr. Levora Angel in the spring 2018.  His mother had gallbladder disease.  Christopher Blair was admitted on 11/22/2017 with pancreatitis.  His original lipase on 2/14 was 1,167.  It was down to 54 yesterday. His original WBC was 26,900, now down to 12,200 (11/25/2017).     MRCP on 11/22/2017 showed 1. Cholelithiasis with mild intra and extrahepatic biliary duct dilatation. 2, potentially 3, stones are seen in the distal common bile duct just proximal to the ampulla. 2. Edematous enlargement of the pancreatic head with edema in and around the pancreas and duodenum. No evidence for pancreatic.  He underwent a ERCP yesterday by Dr. Leone Payor which showed a clear CBD.  He is followed by [facility], Dr.  Levora Angel.  Last night he had worsening abdominal pain.    Past Medical History:  Diagnosis Date  . Hypothyroidism   . Myasthenia gravis 1994   in remission since 1996      Past Surgical History:  Procedure Laterality Date  . TOTAL THYMECTOMY  1994      Current Facility-Administered Medications  Medication Dose Route Frequency Provider Last Rate Last Dose  . 0.9 %  sodium chloride infusion   Intravenous Continuous Brahmbhatt, Parag, MD 150 mL/hr at 11/25/17 0820    . acetaminophen (TYLENOL) tablet 650 mg  650 mg Oral Q6H PRN Dorothea Ogle, MD   650 mg at 11/24/17 1827  . Chlorhexidine Gluconate Cloth 2 % PADS 6 each  6 each Topical Daily Dorothea Ogle, MD   6 each at 11/25/17 1036  . enoxaparin (LOVENOX) injection 40 mg  40 mg Subcutaneous QHS Dorothea Ogle, MD   40 mg at 11/24/17 2128  . HYDROmorphone (DILAUDID) injection 1 mg  1 mg Intravenous Q3H PRN Iva Boop, MD   1 mg at 11/25/17 0654  . lactated ringers infusion   Intravenous Continuous Sherald Barge M, CRNA      . levothyroxine (SYNTHROID, LEVOTHROID) injection 50 mcg  50 mcg Intravenous Daily Dorothea Ogle, MD   50 mcg at 11/25/17 1034  . metoCLOPramide (REGLAN) injection 10 mg  10 mg Intravenous Q8H PRN Opyd, Lavone Neri, MD  10 mg at 11/24/17 0208  . mupirocin ointment (BACTROBAN) 2 % 1 application  1 application Nasal BID Dorothea Ogle, MD   1 application at 11/25/17 1035  . ondansetron (ZOFRAN) tablet 4 mg  4 mg Oral Q6H PRN Dorothea Ogle, MD       Or  . ondansetron [facility]) injection 4 mg  4 mg Intravenous Q6H PRN Dorothea Ogle, MD   4 mg at 11/25/17 0018  . piperacillin-tazobactam (ZOSYN) IVPB 3.375 g  3.375 g Intravenous Q8H Dorothea Ogle, MD   Stopped at 11/25/17 0818      Allergies  Allergen Reactions  . Mucinex [Guaifenesin Er] Other (See Comments)    vomiting    REVIEW OF SYSTEMS: Skin:  No history of rash.  No history of abnormal moles. Infection:  No history of hepatitis or HIV.  No  history of MRSA. Neurologic:  No history of stroke.  No history of seizure.  No history of headaches. Cardiac:  No history of hypertension. No history of heart disease.  No history of seeing a cardiologist. Pulmonary:  Does not smoke cigarettes.  No asthma or bronchitis.  No OSA/CPAP.  Endocrine:  Thymectomy in 1994 for mysthenia gravis Gastrointestinal:  See HPI Urologic:  No history of kidney stones.  No history of bladder infections. Musculoskeletal:  No history of joint or back disease. Hematologic:  No bleeding disorder.  No history of anemia.  Not anticoagulated. Psycho-social:  The patient is oriented.   The patient has no obvious psychologic or social impairment to understanding our conversation and plan.  SOCIAL and FAMILY HISTORY: Married.  His wife, Christopher Blair, and son, Christopher Blair, are in the room.      He has two other daughters.  Family history of of gall bladder disease.  PHYSICAL EXAM: BP 123/80 (BP Location: Left Arm)   Pulse 78   Temp 98.6 F (37 C) (Oral)   Resp 20   Ht 5\' 8"  (1.727 m)   Wt 93.4 kg (205 lb 14.6 oz)   SpO2 93%   BMI 31.31 kg/m   General: WN WM who is alert.  He looks a little uncomfortable. Skin:  Inspection and palpation - no mass or rash. Eyes:  Conjunctiva and lids unremarkable.            Pupils are equal Ears, Nose, Mouth, and Throat:  Ears and nose unremarkable            Lips and teeth are unremarable. Neck: Supple. No mass, trachea midline.  No thyroid mass. Lymph Nodes:  No supraclavicular, cervical, or inguinal nodes. Lungs: Normal respiratory effort.  Clear to auscultation and symmetric breath sounds. Heart:  Palpation of the heart is normal.            Auscultation: RRR. No murmur or rub.  Abdomen: Soft. No mass.  No hernia.             Decreased bowel sounds.  No abdominal scars. Distended. MIld abdominal discomfort on exam. Rectal: Not done. Musculoskeletal:  Good muscle strength and ROM  in upper and lower extremities. Neurologic:   Grossly intact to motor and sensory function. Psychiatric: Normal judgement and insight. Behavior is normal.            Oriented to time, person, place.   DATA REVIEWED, COUNSELING AND COORDINATION OF CARE: Epic notes reviewed. Counseling and coordination of care exceeded more than 50% of the time spent with patient. Total time spent with patient and charting:  60 minutes  Ovidio Kinavid Lashay Osborne, MD,  [facility], [place] [address]  [address]   [place], [place]    [id number] Phone:  [phone] FAX:  [phone]

## 2017-11-26 ENCOUNTER — Inpatient Hospital Stay (HOSPITAL_COMMUNITY): Payer: BLUE CROSS/BLUE SHIELD | Admitting: Certified Registered Nurse Anesthetist

## 2017-11-26 ENCOUNTER — Encounter (HOSPITAL_COMMUNITY)
Admission: EM | Disposition: A | Discharge status: Home / Self Care | Payer: Self-pay | Source: Home / Self Care | Attending: Internal Medicine

## 2017-11-26 ENCOUNTER — Encounter (HOSPITAL_COMMUNITY): Payer: Self-pay | Admitting: Internal Medicine

## 2017-11-26 ENCOUNTER — Inpatient Hospital Stay (HOSPITAL_COMMUNITY): Payer: BLUE CROSS/BLUE SHIELD

## 2017-11-26 HISTORY — PX: CHOLECYSTECTOMY: SHX55

## 2017-11-26 LAB — CBC
HEMATOCRIT: 34.2 % — AB (ref 39.0–52.0)
HEMOGLOBIN: 11.5 g/dL — AB (ref 13.0–17.0)
MCH: 31.3 pg (ref 26.0–34.0)
MCHC: 33.6 g/dL (ref 30.0–36.0)
MCV: 92.9 fL (ref 78.0–100.0)
Platelets: 207 10*3/uL (ref 150–400)
RBC: 3.68 MIL/uL — AB (ref 4.22–5.81)
RDW: 13.5 % (ref 11.5–15.5)
WBC: 11 10*3/uL — ABNORMAL HIGH (ref 4.0–10.5)

## 2017-11-26 LAB — COMPREHENSIVE METABOLIC PANEL
ALBUMIN: 2.1 g/dL — AB (ref 3.5–5.0)
ALT: 93 U/L — ABNORMAL HIGH (ref 17–63)
AST: 26 U/L (ref 15–41)
Alkaline Phosphatase: 165 U/L — ABNORMAL HIGH (ref 38–126)
Anion gap: 7 (ref 5–15)
BUN: 13 mg/dL (ref 6–20)
CHLORIDE: 109 mmol/L (ref 101–111)
CO2: 23 mmol/L (ref 22–32)
Calcium: 7.6 mg/dL — ABNORMAL LOW (ref 8.9–10.3)
Creatinine, Ser: 0.8 mg/dL (ref 0.61–1.24)
GFR calc Af Amer: >60 mL/min (ref >60)
GFR calc non Af Amer: >60 mL/min (ref >60)
GLUCOSE: 103 mg/dL — AB (ref 65–99)
POTASSIUM: 3.2 mmol/L — AB (ref 3.5–5.1)
SODIUM: 139 mmol/L (ref 135–145)
Total Bilirubin: 1.5 mg/dL — ABNORMAL HIGH (ref 0.3–1.2)
Total Protein: 5.5 g/dL — ABNORMAL LOW (ref 6.5–8.1)

## 2017-11-26 LAB — LIPASE, BLOOD: Lipase: 24 U/L (ref 11–51)

## 2017-11-26 SURGERY — LAPAROSCOPIC CHOLECYSTECTOMY WITH INTRAOPERATIVE CHOLANGIOGRAM
Anesthesia: General

## 2017-11-26 MED ORDER — FENTANYL CITRATE (PF) 100 MCG/2ML IJ SOLN
INTRAMUSCULAR | Status: DC | PRN
  Administered 2017-11-26: 50 ug via INTRAVENOUS
  Administered 2017-11-26: 100 ug via INTRAVENOUS
  Administered 2017-11-26: 50 ug via INTRAVENOUS

## 2017-11-26 MED ORDER — FENTANYL CITRATE (PF) 100 MCG/2ML IJ SOLN
INTRAMUSCULAR | Status: AC
  Filled 2017-11-26: qty 2

## 2017-11-26 MED ORDER — LIDOCAINE 2% (20 MG/ML) 5 ML SYRINGE
INTRAMUSCULAR | Status: AC
  Filled 2017-11-26: qty 5

## 2017-11-26 MED ORDER — ROCURONIUM BROMIDE 50 MG/5ML IV SOSY
PREFILLED_SYRINGE | INTRAVENOUS | Status: DC | PRN
  Administered 2017-11-26: 20 mg via INTRAVENOUS
  Administered 2017-11-26 (×2): 10 mg via INTRAVENOUS
  Administered 2017-11-26: 50 mg via INTRAVENOUS

## 2017-11-26 MED ORDER — LACTATED RINGERS IV SOLN
INTRAVENOUS | Status: AC | PRN
  Administered 2017-11-26: 1000 mL via INTRAVENOUS

## 2017-11-26 MED ORDER — PROPOFOL 10 MG/ML IV BOLUS
INTRAVENOUS | Status: DC | PRN
  Administered 2017-11-26: 150 mg via INTRAVENOUS

## 2017-11-26 MED ORDER — BUPIVACAINE-EPINEPHRINE 0.25% -1:200000 IJ SOLN
INTRAMUSCULAR | Status: AC
  Filled 2017-11-26: qty 1

## 2017-11-26 MED ORDER — HYDROMORPHONE HCL 1 MG/ML IJ SOLN: 0.2500 mg | INTRAMUSCULAR | Status: DC | PRN

## 2017-11-26 MED ORDER — ONDANSETRON HCL 4 MG/2ML IJ SOLN
INTRAMUSCULAR | Status: AC
  Filled 2017-11-26: qty 2

## 2017-11-26 MED ORDER — IOPAMIDOL (ISOVUE-300) INJECTION 61%
INTRAVENOUS | Status: AC
  Filled 2017-11-26: qty 50

## 2017-11-26 MED ORDER — LACTATED RINGERS IV SOLN: INTRAVENOUS | Status: DC

## 2017-11-26 MED ORDER — ROCURONIUM BROMIDE 10 MG/ML (PF) SYRINGE
PREFILLED_SYRINGE | INTRAVENOUS | Status: AC
  Filled 2017-11-26: qty 5

## 2017-11-26 MED ORDER — DEXAMETHASONE SODIUM PHOSPHATE 4 MG/ML IJ SOLN
INTRAMUSCULAR | Status: DC | PRN
  Administered 2017-11-26: 10 mg via INTRAVENOUS

## 2017-11-26 MED ORDER — LACTATED RINGERS IV SOLN
INTRAVENOUS | Status: DC | PRN
  Administered 2017-11-26: 11:00:00 via INTRAVENOUS

## 2017-11-26 MED ORDER — POTASSIUM CHLORIDE 10 MEQ/100ML IV SOLN
10.0000 meq | INTRAVENOUS | Status: AC
  Administered 2017-11-26 (×4): 10 meq via INTRAVENOUS
  Filled 2017-11-26 (×3): qty 100

## 2017-11-26 MED ORDER — PROPOFOL 10 MG/ML IV BOLUS
INTRAVENOUS | Status: AC
  Filled 2017-11-26: qty 20

## 2017-11-26 MED ORDER — SODIUM CHLORIDE 0.9 % IV SOLN
INTRAVENOUS | Status: DC
  Administered 2017-11-26: 18:00:00 via INTRAVENOUS

## 2017-11-26 MED ORDER — PROMETHAZINE HCL 25 MG/ML IJ SOLN: 6.2500 mg | INTRAMUSCULAR | Status: DC | PRN

## 2017-11-26 MED ORDER — SUGAMMADEX SODIUM 500 MG/5ML IV SOLN
INTRAVENOUS | Status: DC | PRN
  Administered 2017-11-26: 373.6 mg via INTRAVENOUS

## 2017-11-26 MED ORDER — MEPERIDINE HCL 50 MG/ML IJ SOLN: 6.2500 mg | INTRAMUSCULAR | Status: DC | PRN

## 2017-11-26 MED ORDER — MIDAZOLAM HCL 5 MG/5ML IJ SOLN
INTRAMUSCULAR | Status: DC | PRN
  Administered 2017-11-26: 2 mg via INTRAVENOUS

## 2017-11-26 MED ORDER — BUPIVACAINE-EPINEPHRINE 0.25% -1:200000 IJ SOLN
INTRAMUSCULAR | Status: DC | PRN
  Administered 2017-11-26: 20 mL

## 2017-11-26 MED ORDER — SUGAMMADEX SODIUM 500 MG/5ML IV SOLN
INTRAVENOUS | Status: AC
  Filled 2017-11-26: qty 5

## 2017-11-26 MED ORDER — LIDOCAINE 2% (20 MG/ML) 5 ML SYRINGE
INTRAMUSCULAR | Status: DC | PRN
  Administered 2017-11-26: 100 mg via INTRAVENOUS

## 2017-11-26 MED ORDER — SODIUM CHLORIDE 0.9 % IV SOLN
INTRAVENOUS | Status: DC | PRN
  Administered 2017-11-26: 12:00:00

## 2017-11-26 MED ORDER — DEXAMETHASONE SODIUM PHOSPHATE 10 MG/ML IJ SOLN
INTRAMUSCULAR | Status: AC
  Filled 2017-11-26: qty 1

## 2017-11-26 MED ORDER — MIDAZOLAM HCL 2 MG/2ML IJ SOLN
INTRAMUSCULAR | Status: AC
  Filled 2017-11-26: qty 2

## 2017-11-26 MED ORDER — ONDANSETRON HCL 4 MG/2ML IJ SOLN
INTRAMUSCULAR | Status: DC | PRN
  Administered 2017-11-26: 4 mg via INTRAVENOUS

## 2017-11-26 SURGICAL SUPPLY — 41 items
ADH SKN CLS APL DERMABOND .7 (GAUZE/BANDAGES/DRESSINGS) ×1
APPLIER CLIP 5 13 M/L LIGAMAX5 (MISCELLANEOUS) ×2
APR CLP MED LRG 5 ANG JAW (MISCELLANEOUS) ×1
BAG SPEC RTRVL LRG 6X4 10 (ENDOMECHANICALS) ×1
CABLE HIGH FREQUENCY MONO STRZ (ELECTRODE) ×2 IMPLANT
CHLORAPREP W/TINT 26ML (MISCELLANEOUS) ×2 IMPLANT
CLIP APPLIE 5 13 M/L LIGAMAX5 (MISCELLANEOUS) ×1 IMPLANT
COVER MAYO STAND STRL (DRAPES) ×2 IMPLANT
DERMABOND ADVANCED (GAUZE/BANDAGES/DRESSINGS) ×1
DERMABOND ADVANCED .7 DNX12 (GAUZE/BANDAGES/DRESSINGS) ×1 IMPLANT
DRAIN CHANNEL 19F RND (DRAIN) ×1 IMPLANT
DRAPE C-ARM 42X120 X-RAY (DRAPES) ×2 IMPLANT
ELECT REM PT RETURN 15FT ADLT (MISCELLANEOUS) ×2 IMPLANT
ENDOLOOP SUT PDS II  0 18 (SUTURE) ×2
ENDOLOOP SUT PDS II 0 18 (SUTURE) IMPLANT
EVACUATOR 3/16  PVC DRAIN (DRAIN) ×1
EVACUATOR 3/16 PVC DRAIN (DRAIN) IMPLANT
GLOVE BIO SURGEON STRL SZ 6.5 (GLOVE) ×2 IMPLANT
GLOVE BIOGEL PI IND STRL 7.0 (GLOVE) ×1 IMPLANT
GLOVE BIOGEL PI INDICATOR 7.0 (GLOVE) ×1
GOWN STRL REUS W/TWL 2XL LVL3 (GOWN DISPOSABLE) ×2 IMPLANT
GOWN STRL REUS W/TWL XL LVL3 (GOWN DISPOSABLE) ×4 IMPLANT
IRRIG SUCT STRYKERFLOW 2 WTIP (MISCELLANEOUS) ×2
IRRIGATION SUCT STRKRFLW 2 WTP (MISCELLANEOUS) ×1 IMPLANT
IV CATH 14GX2 1/4 (CATHETERS) ×2 IMPLANT
KIT BASIN OR (CUSTOM PROCEDURE TRAY) ×2 IMPLANT
POUCH SPECIMEN RETRIEVAL 10MM (ENDOMECHANICALS) ×2 IMPLANT
SCISSORS LAP 5X35 DISP (ENDOMECHANICALS) ×2 IMPLANT
SET CHOLANGIOGRAPH MIX (MISCELLANEOUS) ×2 IMPLANT
SLEEVE XCEL OPT CAN 5 100 (ENDOMECHANICALS) ×4 IMPLANT
SPONGE DRAIN TRACH 4X4 STRL 2S (GAUZE/BANDAGES/DRESSINGS) ×1 IMPLANT
SUT ETHILON 3 0 PS 1 (SUTURE) ×1 IMPLANT
SUT VIC AB 2-0 SH 27 (SUTURE) ×2
SUT VIC AB 2-0 SH 27X BRD (SUTURE) ×1 IMPLANT
SUT VIC AB 4-0 PS2 18 (SUTURE) ×2 IMPLANT
TOWEL OR 17X26 10 PK STRL BLUE (TOWEL DISPOSABLE) ×2 IMPLANT
TOWEL OR NON WOVEN STRL DISP B (DISPOSABLE) ×2 IMPLANT
TRAY LAPAROSCOPIC (CUSTOM PROCEDURE TRAY) ×2 IMPLANT
TROCAR XCEL BLUNT TIP 100MML (ENDOMECHANICALS) ×2 IMPLANT
TROCAR XCEL NON-BLD 5MMX100MML (ENDOMECHANICALS) ×2 IMPLANT
TUBING INSUF HEATED (TUBING) ×2 IMPLANT

## 2017-11-26 NOTE — Progress Notes (Signed)
Gallstone pancreatitis  Subjective: Feels better this AM.    Objective: Vital signs in last 24 hours: Temp:  [98.3 F (36.8 C)-99.2 F (37.3 C)] 98.3 F (36.8 C) (02/18 0612) Pulse Rate:  [74-91] 74 (02/18 0612) Resp:  [16-17] 16 (02/18 0612) BP: (134-145)/(78-89) 137/88 (02/18 0612) SpO2:  [92 %-95 %] 94 % (02/18 0612) Last BM Date: 11/26/17  Intake/Output from previous day: 02/17 0701 - 02/18 0700 In: 4250 [P.O.:500; I.V.:3600; IV Piggyback:150] Out: -  Intake/Output this shift: No intake/output data recorded.  General appearance: alert GI: normal findings: soft, non-tender  Lab Results:  Results for orders placed or performed during the hospital encounter of 11/22/17 (from the past 24 hour(s))  Comprehensive metabolic panel     Status: Abnormal   Collection Time: 11/26/17  4:59 AM  Result Value Ref Range   Sodium 139 135 - 145 mmol/L   Potassium 3.2 (L) 3.5 - 5.1 mmol/L   Chloride 109 101 - 111 mmol/L   CO2 23 22 - 32 mmol/L   Glucose, Bld 103 (H) 65 - 99 mg/dL   BUN 13 6 - 20 mg/dL   Creatinine, Ser 5.780.80 0.61 - 1.24 mg/dL   Calcium 7.6 (L) 8.9 - 10.3 mg/dL   Total Protein 5.5 (L) 6.5 - 8.1 g/dL   Albumin 2.1 (L) 3.5 - 5.0 g/dL   AST 26 15 - 41 U/L   ALT 93 (H) 17 - 63 U/L   Alkaline Phosphatase 165 (H) 38 - 126 U/L   Total Bilirubin 1.5 (H) 0.3 - 1.2 mg/dL   GFR calc non Af Amer >60 >60 mL/min   GFR calc Af Amer >60 >60 mL/min   Anion gap 7 5 - 15  CBC     Status: Abnormal   Collection Time: 11/26/17  4:59 AM  Result Value Ref Range   WBC 11.0 (H) 4.0 - 10.5 K/uL   RBC 3.68 (L) 4.22 - 5.81 MIL/uL   Hemoglobin 11.5 (L) 13.0 - 17.0 g/dL   HCT 46.934.2 (L) 62.939.0 - 52.852.0 %   MCV 92.9 78.0 - 100.0 fL   MCH 31.3 26.0 - 34.0 pg   MCHC 33.6 30.0 - 36.0 g/dL   RDW 41.313.5 24.411.5 - 01.015.5 %   Platelets 207 150 - 400 K/uL  Lipase, blood     Status: None   Collection Time: 11/26/17  4:59 AM  Result Value Ref Range   Lipase 24 11 - 51 U/L      Studies/Results Radiology     MEDS, Scheduled . Chlorhexidine Gluconate Cloth  6 each Topical Daily  . enoxaparin (LOVENOX) injection  40 mg Subcutaneous QHS  . levothyroxine  50 mcg Intravenous Daily  . mupirocin ointment  1 application Nasal BID     Assessment: Gallstone pancreatitis ERCP complete and CBD cleared  Plan: OR today for lap chole, possible IOC.  The anatomy & physiology of hepatobiliary & pancreatic function was discussed.  The pathophysiology of gallbladder dysfunction was discussed.  Natural history risks without surgery was discussed.   I feel the risks of no intervention will lead to serious problems that outweigh the operative risks; therefore, I recommended cholecystectomy to remove the pathology.  I explained laparoscopic techniques with possible need for an open approach.  Probable cholangiogram to evaluate the bilary tract was explained as well.    Risks such as bleeding, infection, abscess, leak, injury to other organs, need for further treatment, heart attack, death, and other risks were discussed.  I noted  a good likelihood this will help address the problem.  Possibility that this will not correct all abdominal symptoms was explained.  Goals of post-operative recovery were discussed as well.  We will work to minimize complications.  An educational handout further explaining the pathology and treatment options was given as well.  Questions were answered.  The patient expresses understanding & wishes to proceed with surgery.   LOS: 4 days    Vanita Panda, MD [facility], [place] [phone]   11/26/2017 9:39 AM

## 2017-11-26 NOTE — Anesthesia Procedure Notes (Signed)
Procedure Name: Intubation Date/Time: 11/26/2017 11:30 AM Performed by: Deliah Boston, CRNA Pre-anesthesia Checklist: Patient identified, Emergency Drugs available, Suction available and Patient being monitored Patient Re-evaluated:Patient Re-evaluated prior to induction Oxygen Delivery Method: Circle system utilized Preoxygenation: Pre-oxygenation with 100% oxygen Induction Type: IV induction Ventilation: Mask ventilation without difficulty Laryngoscope Size: Mac and 4 Grade View: Grade I Tube type: Oral Tube size: 7.5 mm Number of attempts: 1 Airway Equipment and Method: Stylet and Oral airway Placement Confirmation: ETT inserted through vocal cords under direct vision,  positive ETCO2 and breath sounds checked- equal and bilateral Secured at: 19 cm Tube secured with: Tape Dental Injury: Teeth and Oropharynx as per pre-operative assessment

## 2017-11-26 NOTE — Progress Notes (Signed)
Patient ID: Christopher Blair, male   DOB: 04-Feb-1961, 57 y.o.   MRN: [id number]    PROGRESS NOTE  Christopher Blair  WUJ:[id number] DOB: 01/23/1961 DOA: 11/22/2017  PCP: Shelva Majestic, MD   Brief Narrative:   History of present illness: Patient is 57 year old male with known history of myasthenia gravis that has been stable and in remission for 20 years, presented to [facility] emergency department with main concern of 3 days duration of diffuse abdominal pain, sharp and intermittent, stabbing like at times, 10/10 in severity, radiating to the back, associated with nausea and nonbloody vomiting, unable to keep anything down. Patient also reports associated dyspnea, pain with deep breaths. Patient denies fevers or chills, no recent sick contacts or exposures, no similar events in the past. Patient denies chest pain, no specific urinary concerns.  In emergency department, patient noted to be in mild distress due to pain, vital signs notable for temperature 98.7, heart rate up to 123, blood pressure 127/98. Blood work notable for WBC 26.9, hemoglobin 17.2, ALT 259, AST 671 lipase 1100. Concern for gallstone pancreatitis. TRH asked to admit for further evaluation. ER doctor consulted with GI on call, recommendation is for stat MRCP, hold off on antibiotics.   Assessment & Plan:   Assessment/Plan Active Problems: Sepsis, Acute diffuse abdominal pain, transaminitis, pancreatitis - per MRCP, cholelithiasis with mild intra-and extrahepatic biliary duct dilatation - Edematous enlargement of the pancreatic head also noted - ERCP done 02/16, stent placed, no stones noted and likely because pt already passed stones - LFT and lipase trending down, WBC trending down  - Continue empiric antibiotic day #4 for now - appreciate surgery and GI team assistance  - plan to take to OR today   Constipation - had BM yesterday and this AM   Elevated hemoglobin - From dehydration - resolved    Hypokalemia - supplement and repeat BMP in AM  Hypothyroidism - will plan to change to PO if pt able to toelrate soft diet   DVT prophylaxis: Lovenox SQ Code Status: Full code Family Communication: pt at bedside  Disposition Plan: To be determined  Consultants:   GI   Surgery   Procedures:   MRCP 02/14  Antimicrobials:   Zosyn 02/14 -->  Subjective: Pt reports feeling better.   Objective: Vitals:   11/24/17 2102 11/25/17 1530 11/25/17 2055 11/26/17 0612  BP: 123/80 134/78 (!) 145/89 137/88  Pulse: 78 91 85 74  Resp: 20 16 17 16   Temp: 98.6 F (37 C) 99.2 F (37.3 C) 98.5 F (36.9 C) 98.3 F (36.8 C)  TempSrc: Oral Oral Oral Oral  SpO2: 93% 92% 95% 94%  Weight:      Height:        Intake/Output Summary (Last 24 hours) at 11/26/2017 0958 Last data filed at 11/26/2017 0600 Gross per 24 hour  Intake 4250 ml  Output -  Net 4250 ml   Filed Weights   11/22/17 2226 11/24/17 0403  Weight: 89.5 kg (197 lb 6.4 oz) 93.4 kg (205 lb 14.6 oz)   Physical Exam  Constitutional: Appears well-developed and well-nourished. No distress.  CVS: RRR, S1/S2 +, no murmurs, no gallops, no carotid bruit.  Pulmonary: Effort and breath sounds normal, no stridor, rhonchi, wheezes, rales.  Abdominal: Soft. BS +,  no distension, mild tenderness in epigastric area  Musculoskeletal: Normal range of motion. No edema and no tenderness.  Neuro: Alert. Normal reflexes, muscle tone coordination. No cranial nerve deficit. Psychiatric: Normal mood and  affect. Behavior, judgment, thought content normal.    Data Reviewed: I have personally reviewed following labs and imaging studies  CBC: Recent Labs  Lab 11/22/17 1329 11/23/17 0352 11/24/17 0415 11/25/17 0424 11/26/17 0459  WBC 26.9* 23.7* 15.5* 12.2* 11.0*  HGB 17.2* 14.6 13.0 12.6* 11.5*  HCT 49.0 43.1 38.2* 37.5* 34.2*  MCV 90.9 92.7 94.3 94.0 92.9  PLT 288 232 190 207 207   Basic Metabolic Panel: Recent Labs  Lab  11/22/17 1329 11/23/17 0352 11/24/17 0415 11/25/17 0424 11/26/17 0459  NA 136 139 140 142 139  K 3.6 3.5 3.5 3.8 3.2*  CL 103 106 108 110 109  CO2 21* 22 22 26 23   GLUCOSE 131* 116* 83 110* 103*  BUN 18 19 14 16 13   CREATININE 0.87 1.03 0.97 0.81 0.80  CALCIUM 8.9 8.2* 8.0* 7.8* 7.6*   Liver Function Tests: Recent Labs  Lab 11/22/17 1329 11/23/17 0352 11/24/17 0415 11/25/17 0424 11/26/17 0459  AST 259* 122* 47* 30 26  ALT 677* 434* 217* 135* 93*  ALKPHOS 471* 330* 227* 191* 165*  BILITOT 8.8* 3.8* 2.7* 1.7* 1.5*  PROT 7.8 6.7 5.9* 5.5* 5.5*  ALBUMIN 4.0 3.3* 2.6* 2.3* 2.1*   Recent Labs  Lab 11/22/17 1329 11/23/17 0352 11/24/17 0415 11/25/17 0424 11/26/17 0459  LIPASE 1,167* 136* 54* 29 24   Coagulation Profile: Recent Labs  Lab 11/22/17 1743 11/22/17 1944  INR 1.25 1.25   Urine analysis:    Component Value Date/Time   COLORURINE AMBER (A) 11/22/2017 1354   APPEARANCEUR HAZY (A) 11/22/2017 1354   LABSPEC 1.025 11/22/2017 1354   PHURINE 5.0 11/22/2017 1354   GLUCOSEU NEGATIVE 11/22/2017 1354   GLUCOSEU NEGATIVE 05/27/2009 0727   HGBUR MODERATE (A) 11/22/2017 1354   BILIRUBINUR MODERATE (A) 11/22/2017 1354   BILIRUBINUR n 10/27/2015 0931   KETONESUR 5 (A) 11/22/2017 1354   PROTEINUR 30 (A) 11/22/2017 1354   UROBILINOGEN 0.2 10/27/2015 0931   UROBILINOGEN 0.2 05/27/2009 0727   NITRITE NEGATIVE 11/22/2017 1354   LEUKOCYTESUR NEGATIVE 11/22/2017 1354    Recent Results (from the past 240 hour(s))  Culture, blood (x 2)     Status: None (Preliminary result)   Collection Time: 11/22/17  7:35 PM  Result Value Ref Range Status   Specimen Description   Final    BLOOD LEFT ANTECUBITAL Performed at [facility], [address] [place], [place] [address]    Special Requests   Final    BOTTLES DRAWN AEROBIC AND ANAEROBIC Blood Culture adequate volume Performed at [facility], [address] [place], [place]  [address]    Culture   Final    NO GROWTH 3 DAYS Performed at [facility], [address] [place], [place] [address]    Report Status PENDING  Incomplete  Culture, blood (x 2)     Status: None (Preliminary result)   Collection Time: 11/22/17  7:44 PM  Result Value Ref Range Status   Specimen Description   Final    BLOOD LEFT HAND Performed at [facility], [address] [place], [place] [address]    Special Requests   Final    BOTTLES DRAWN AEROBIC AND ANAEROBIC Blood Culture adequate volume Performed at [facility], [address] [place], [place] [address]    Culture   Final    NO GROWTH 3 DAYS Performed at [facility], [address] [place], [place] [address]  Report Status PENDING  Incomplete  Surgical PCR screen     Status: Abnormal   Collection Time: 11/22/17 10:56 PM  Result Value Ref Range Status   MRSA, PCR NEGATIVE NEGATIVE Final   Staphylococcus aureus POSITIVE (A) NEGATIVE Final    Comment: (NOTE) The Xpert SA Assay (FDA approved for NASAL specimens in patients 58 years of age and older), is one component of a comprehensive surveillance program. It is not intended to diagnose infection nor to guide or monitor treatment. Performed at [facility], [address] [place], [place] [address]      Radiology Studies: Mr Abdomen Mrcp Vivien Rossetti Contast Result Date: 11/22/2017 1. Cholelithiasis with mild intra and extrahepatic biliary duct dilatation. 2, potentially 3, stones are seen in the distal common bile duct just proximal to the ampulla. 2. Edematous enlargement of the pancreatic head with edema in and around the pancreas and duodenum. No evidence for pancreatic pseudocyst. No dilatation of the main pancreatic duct.   Scheduled Meds: . Chlorhexidine Gluconate Cloth  6 each Topical Daily  . enoxaparin (LOVENOX) injection  40 mg Subcutaneous QHS  . levothyroxine  50 mcg Intravenous Daily  .  mupirocin ointment  1 application Nasal BID   Continuous Infusions: . sodium chloride 150 mL/hr at 11/25/17 1602  . lactated ringers    . piperacillin-tazobactam (ZOSYN)  IV 3.375 g (11/26/17 0424)     LOS: 4 days   Time spent: 25 minutes   Debbora Presto, MD [facility] Pager [phone]  If 7PM-7AM, please contact night-coverage [email or web address] Password [facility] 11/26/2017, 9:58 AM

## 2017-11-26 NOTE — Anesthesia Postprocedure Evaluation (Signed)
Anesthesia Post Note  Patient: Christopher Blair  Procedure(s) Performed: LAPAROSCOPIC CHOLECYSTECTOMY WITH  POSSIBLE INTRAOPERATIVE CHOLANGIOGRAM (N/A )     Patient location during evaluation: PACU Anesthesia Type: General Level of consciousness: awake and alert Pain management: pain level controlled Vital Signs Assessment: post-procedure vital signs reviewed and stable Respiratory status: spontaneous breathing, nonlabored ventilation, respiratory function stable and patient connected to nasal cannula oxygen Cardiovascular status: blood pressure returned to baseline and stable Postop Assessment: no apparent nausea or vomiting Anesthetic complications: no    Last Vitals:  Vitals:   11/26/17 1415 11/26/17 1435  BP: 137/85 (!) 145/82  Pulse: 78 83  Resp: 17 18  Temp:  36.6 C  SpO2: 95% 97%    Last Pain:  Vitals:   11/26/17 1415  TempSrc:   PainSc: Asleep                 Shelton SilvasKevin D Kearston Putman

## 2017-11-26 NOTE — Op Note (Signed)
11/26/2017  2:03 PM  PATIENT:  Christopher Blair  57 y.o. male  Patient Care Team: Shelva Majestic, MD as PCP - General (Family Medicine)  PRE-OPERATIVE DIAGNOSIS:  Cholelithiasis, gallstone pancreatitis  POST-OPERATIVE DIAGNOSIS:  Cholelithiasis, gallstone pancreatitis  PROCEDURE:   LAPAROSCOPIC CHOLECYSTECTOMY   Surgeon(s): Romie Levee, MD  ASSISTANT: none   ANESTHESIA:   local and general  EBL:12ml  Total I/O In: 1550 [I.V.:1550] Out: 50 [Blood:50]  DRAINS: (72F) Jackson-Pratt drain(s) with closed bulb suction in the RUQ   SPECIMEN:  Source of Specimen:  gallbladder  DISPOSITION OF SPECIMEN:  PATHOLOGY  COUNTS:  YES  PLAN OF CARE: inpatient  PATIENT DISPOSITION:  PACU - hemodynamically stable.  INDICATION: 57 y.o. M with gallstone pancreatitis s/p ERCP and stent.  The anatomy & physiology of hepatobiliary & pancreatic function was discussed.  The pathophysiology of gallbladder dysfunction was discussed.  Natural history risks without surgery was discussed.   I feel the risks of no intervention will lead to serious problems that outweigh the operative risks; therefore, I recommended cholecystectomy to remove the pathology.  I explained laparoscopic techniques with possible need for an open approach.  Probable cholangiogram to evaluate the bilary tract was explained as well.    Risks such as bleeding, infection, abscess, leak, injury to other organs, need for further treatment, heart attack, death, and other risks were discussed.  I noted a good likelihood this will help address the problem.  Possibility that this will not correct all abdominal symptoms was explained.  Goals of post-operative recovery were discussed as well.    OR FINDINGS: cholelithiasis  DESCRIPTION:   The patient was identified & brought into the operating room. The patient was positioned supine with arms tucked. SCDs were active during the entire case. The patient underwent general  anesthesia without any difficulty.  The abdomen was prepped and draped in a sterile fashion. A Surgical Timeout was performed and confirmed our plan.  We positioned the patient in reverse [place] & right side up.  I placed a Hassan laparoscopic port through the umbilicus using open entry technique.  Entry was clean. There were no adhesions to the anterior abdominal wall supraumbilically.  We induced carbon dioxide insufflation. Camera inspection revealed no injury.    I proceeded to continue with laparoscopic technique. I placed a 5 mm port in mid subcostal region, another 5mm port in the right flank near the anterior axillary line, and a 5mm port in the left subxiphoid region obliquely within the falciform ligament.  I turned attention to the right upper quadrant.    The gallbladder fundus was elevated cephalad.  It was difficult to manipulate due to it being full of stones.  I used cautery and blunt dissection to free the peritoneal coverings between the gallbladder and the liver on the posteriolateral and anteriomedial walls.   I used careful blunt and cautery dissection with a maryland dissector to help get a good critical view of the cystic artery and cystic duct. I did further dissection to free a few centimeters of the  gallbladder off the liver bed to get a good critical view of the infundibulum and cystic duct. I mobilized the cystic artery.  I skeletonized the cystic duct.  After getting a good 360 view, I decided to attempt to perform a cholangiogram.  I placed a clip on the infundibulum.   I did a partial cystic duct-otomy and ensured patency. I placed a 5 F cholangiocatheter through a puncture site at the right  subcostal ridge of the abdominal wall and directed it into the cystic duct.  I could not get this to thread into the cystic duct, most likely to obstruction of the stent.  I removed the cholangiocatheter.  The cystic duct was closed with 2 endo loops.     I completed cystic duct  transection.   I placed clips on the cystic artery x3 with 2 proximally.  I ligated the cystic artery using scissors. I freed the gallbladder from its remaining attachments to the liver.  The back wall fell apart and his gallstones spilled into the RUQ.  I completed the resection of the gallbladder wall from the remaining liver bed.  I ensured hemostasis on the gallbladder fossa of the liver and elsewhere. I inspected the rest of the abdomen & detected no injury nor bleeding elsewhere. I removed the gallstones using suction and the stone scoop.  Once all the stones I could see were removed, I irrigated the RUQ with normal saline.  I placed a 7519 F blake drain in the RUQ and brought this out through the subcostal port.    I removed the gallbladder through the umbilical port site.  I closed the umbilical fascia using 0 Vicryl stitches.  The subcutaneous tissue was closed with 2-0 Vicryl sutures.  I closed the skin using 4-0 vicryl stitch.  Sterile dressings were applied. The patient was extubated & arrived in the PACU in stable condition.  I had discussed postoperative care with the patient in the holding area.   I will discuss  operative findings and postoperative goals / instructions with the patient's family.  Instructions are written in the chart as well.

## 2017-11-26 NOTE — Anesthesia Preprocedure Evaluation (Addendum)
Anesthesia Evaluation  Patient identified by MRN, date of birth, ID band Patient awake    Reviewed: Allergy & Precautions, NPO status , Patient's Chart, lab work & pertinent test results  Airway Mallampati: I  TM Distance: >3 FB Neck ROM: Full    Dental  (+) Teeth Intact, Dental Advisory Given   Pulmonary neg pulmonary ROS,    breath sounds clear to auscultation       Cardiovascular negative cardio ROS   Rhythm:Regular Rate:Normal     Neuro/Psych  Neuromuscular disease    GI/Hepatic negative GI ROS, Neg liver ROS,   Endo/Other  Hypothyroidism   Renal/GU negative Renal ROS     Musculoskeletal negative musculoskeletal ROS (+)   Abdominal   Peds  Hematology negative hematology ROS (+)   Anesthesia Other Findings   Reproductive/Obstetrics                            Lab Results  Component Value Date   WBC 11.0 (H) 11/26/2017   HGB 11.5 (L) 11/26/2017   HCT 34.2 (L) 11/26/2017   MCV 92.9 11/26/2017   PLT 207 11/26/2017   Lab Results  Component Value Date   CREATININE 0.80 11/26/2017   BUN 13 11/26/2017   NA 139 11/26/2017   K 3.2 (L) 11/26/2017   CL 109 11/26/2017   CO2 23 11/26/2017   Lab Results  Component Value Date   INR 1.25 11/22/2017   INR 1.25 11/22/2017   EKG: sinus tachycardia.  Anesthesia Physical Anesthesia Plan  ASA: II  Anesthesia Plan: General   Post-op Pain Management:    Induction: Intravenous  PONV Risk Score and Plan: 3 and Ondansetron, Dexamethasone and Midazolam  Airway Management Planned: Oral ETT  Additional Equipment: None  Intra-op Plan:   Post-operative Plan: Extubation in OR  Informed Consent: I have reviewed the patients History and Physical, chart, labs and discussed the procedure including the risks, benefits and alternatives for the proposed anesthesia with the patient or authorized representative who has indicated his/her  understanding and acceptance.   Dental advisory given  Plan Discussed with: CRNA  Anesthesia Plan Comments:         Anesthesia Quick Evaluation

## 2017-11-26 NOTE — Progress Notes (Signed)
2 Days Post-Op    CC:  Abdominal pain  Subjective: Feels good  No pain  Objective: Vital signs in last 24 hours: Temp:  [98.3 F (36.8 C)-99.2 F (37.3 C)] 98.3 F (36.8 C) (02/18 0612) Pulse Rate:  [74-91] 74 (02/18 0612) Resp:  [16-17] 16 (02/18 0612) BP: (134-145)/(78-89) 137/88 (02/18 0612) SpO2:  [92 %-95 %] 94 % (02/18 0612) Last BM Date: 11/26/17 500 p.o. recorded yesterday 3750 IV No output recorded T-max 99.2.  Vital signs are stable Potassium is 3.2 Lipase is down to 24 Bilirubin is gone from 3.8 down to 1.5 today  AST/ALT improving WBC improving down to 11,000 today  -   23.7 on 11/23/17 Intake/Output from previous day: 02/17 0701 - 02/18 0700 In: 4250 [P.O.:500; I.V.:3600; IV Piggyback:150] Out: -  Intake/Output this shift: No intake/output data recorded.  General appearance: alert, cooperative and no distress GI: soft, non-tender; bowel sounds normal; no masses,  no organomegaly  Lab Results:  Recent Labs    11/25/17 0424 11/26/17 0459  WBC 12.2* 11.0*  HGB 12.6* 11.5*  HCT 37.5* 34.2*  PLT 207 207    BMET Recent Labs    11/25/17 0424 11/26/17 0459  NA 142 139  K 3.8 3.2*  CL 110 109  CO2 26 23  GLUCOSE 110* 103*  BUN 16 13  CREATININE 0.81 0.80  CALCIUM 7.8* 7.6*   PT/INR No results for input(s): LABPROT, INR in the last 72 hours.  Recent Labs  Lab 11/22/17 1329 11/23/17 0352 11/24/17 0415 11/25/17 0424 11/26/17 0459  AST 259* 122* 47* 30 26  ALT 677* 434* 217* 135* 93*  ALKPHOS 471* 330* 227* 191* 165*  BILITOT 8.8* 3.8* 2.7* 1.7* 1.5*  PROT 7.8 6.7 5.9* 5.5* 5.5*  ALBUMIN 4.0 3.3* 2.6* 2.3* 2.1*     Lipase     Component Value Date/Time   LIPASE 24 11/26/2017 0459     Medications: . Chlorhexidine Gluconate Cloth  6 each Topical Daily  . enoxaparin (LOVENOX) injection  40 mg Subcutaneous QHS  . levothyroxine  50 mcg Intravenous Daily  . mupirocin ointment  1 application Nasal BID   . sodium chloride 150 mL/hr at  11/25/17 1602  . lactated ringers    . piperacillin-tazobactam (ZOSYN)  IV 3.375 g (11/26/17 0424)   Anti-infectives (From admission, onward)   Start     Dose/Rate Route Frequency Ordered Stop   11/23/17 0400  piperacillin-tazobactam (ZOSYN) IVPB 3.375 g     3.375 g 12.5 mL/hr over 240 Minutes Intravenous Every 8 hours 11/22/17 1841     11/22/17 1845  piperacillin-tazobactam (ZOSYN) IVPB 3.375 g     3.375 g 100 mL/hr over 30 Minutes Intravenous  Once 11/22/17 1837 11/22/17 2202      Assessment/Plan Gallstone pancreatitis  -ERCP, biliary stent, no stones found on 11/24/17  -Pancreatitis flare after ERCP 11/25/17 lipase 24 this AM  -Elevated LFTs  -improving LFTs  -Leukocytosis -improving  History of myasthenia gravis Hypothyroid  FEN: IV fluids at 150/h/ n.p.o. ID: Zosyn  - 2/14 =>> day 5 DVT: Lovenox Foley: None Follow-up: [facility]/Dr. Concha SeGassner He has been seen by Dr. Maisie Fushomas and is going to surgery later this AM.       LOS: 4 days    Christopher Blair 11/26/2017 [phone]

## 2017-11-26 NOTE — Transfer of Care (Signed)
Immediate Anesthesia Transfer of Care Note  Patient: Christopher Blair  Procedure(s) Performed: Procedure(s): LAPAROSCOPIC CHOLECYSTECTOMY WITH  POSSIBLE INTRAOPERATIVE CHOLANGIOGRAM (N/A)  Patient Location: PACU  Anesthesia Type:General  Level of Consciousness: Patient easily awoken, sedated, comfortable, cooperative, following commands, responds to stimulation.   Airway & Oxygen Therapy: Patient spontaneously breathing, ventilating well, oxygen via simple oxygen mask.  Post-op Assessment: Report given to PACU RN, vital signs reviewed and stable, moving all extremities.   Post vital signs: Reviewed and stable.  Complications: No apparent anesthesia complications  Last Vitals:  Vitals:   11/26/17 1343 11/26/17 1345  BP: 136/86 135/84  Pulse: 88 90  Resp: (!) 21 19  Temp: 37.1 C   SpO2: 98% 98%    Last Pain:  Vitals:   11/26/17 0745  TempSrc:   PainSc: 0-No pain      Patients Stated Pain Goal: 4 (11/26/17 0745)  Complications: No apparent anesthesia complications

## 2017-11-27 DIAGNOSIS — Z419 Encounter for procedure for purposes other than remedying health state, unspecified: Secondary | ICD-10-CM

## 2017-11-27 LAB — COMPREHENSIVE METABOLIC PANEL
ALBUMIN: 2.1 g/dL — AB (ref 3.5–5.0)
ALT: 104 U/L — ABNORMAL HIGH (ref 17–63)
ANION GAP: 8 (ref 5–15)
AST: 71 U/L — AB (ref 15–41)
Alkaline Phosphatase: 156 U/L — ABNORMAL HIGH (ref 38–126)
BUN: 14 mg/dL (ref 6–20)
CHLORIDE: 107 mmol/L (ref 101–111)
CO2: 23 mmol/L (ref 22–32)
Calcium: 7.8 mg/dL — ABNORMAL LOW (ref 8.9–10.3)
Creatinine, Ser: 0.84 mg/dL (ref 0.61–1.24)
GFR calc Af Amer: >60 mL/min (ref >60)
GFR calc non Af Amer: >60 mL/min (ref >60)
GLUCOSE: 148 mg/dL — AB (ref 65–99)
POTASSIUM: 3.9 mmol/L (ref 3.5–5.1)
Sodium: 138 mmol/L (ref 135–145)
TOTAL PROTEIN: 5.7 g/dL — AB (ref 6.5–8.1)
Total Bilirubin: 1 mg/dL (ref 0.3–1.2)

## 2017-11-27 LAB — CBC
HEMATOCRIT: 34 % — AB (ref 39.0–52.0)
Hemoglobin: 11.5 g/dL — ABNORMAL LOW (ref 13.0–17.0)
MCH: 31.3 pg (ref 26.0–34.0)
MCHC: 33.8 g/dL (ref 30.0–36.0)
MCV: 92.4 fL (ref 78.0–100.0)
Platelets: 257 10*3/uL (ref 150–400)
RBC: 3.68 MIL/uL — ABNORMAL LOW (ref 4.22–5.81)
RDW: 13.3 % (ref 11.5–15.5)
WBC: 10.9 10*3/uL — AB (ref 4.0–10.5)

## 2017-11-27 LAB — CULTURE, BLOOD (ROUTINE X 2)
CULTURE: NO GROWTH
Culture: NO GROWTH
SPECIAL REQUESTS: ADEQUATE
SPECIAL REQUESTS: ADEQUATE

## 2017-11-27 MED ORDER — TRAMADOL HCL 50 MG PO TABS: 50.0000 mg | ORAL_TABLET | Freq: Four times a day (QID) | ORAL | Status: DC | PRN

## 2017-11-27 NOTE — Progress Notes (Signed)
1 Day Post-Op lap chole Subjective: Feels good, pain controlled with tylenol  Objective: Vital signs in last 24 hours: Temp:  [97.6 F (36.4 C)-98.8 F (37.1 C)] 97.6 F (36.4 C) (02/19 0639) Pulse Rate:  [67-90] 67 (02/19 0639) Resp:  [15-21] 15 (02/19 0639) BP: (127-145)/(82-90) 127/85 (02/19 0639) SpO2:  [94 %-98 %] 94 % (02/19 0639)   Intake/Output from previous day: 02/18 0701 - 02/19 0700 In: 3140 [I.V.:2740; IV Piggyback:400] Out: 1033 [Urine:900; Drains:83; Blood:50] Intake/Output this shift: No intake/output data recorded.   General appearance: alert and cooperative GI: normal findings: soft, non-tender and inc: c/d/i JP: SS fluid  I  Lab Results:  Recent Labs    11/26/17 0459 11/27/17 0418  WBC 11.0* 10.9*  HGB 11.5* 11.5*  HCT 34.2* 34.0*  PLT 207 257   BMET Recent Labs    11/26/17 0459 11/27/17 0418  NA 139 138  K 3.2* 3.9  CL 109 107  CO2 23 23  GLUCOSE 103* 148*  BUN 13 14  CREATININE 0.80 0.84  CALCIUM 7.6* 7.8*   PT/INR No results for input(s): LABPROT, INR in the last 72 hours. ABG No results for input(s): PHART, HCO3 in the last 72 hours.  Invalid input(s): PCO2, PO2  MEDS, Scheduled . Chlorhexidine Gluconate Cloth  6 each Topical Daily  . enoxaparin (LOVENOX) injection  40 mg Subcutaneous QHS  . levothyroxine  50 mcg Intravenous Daily  . mupirocin ointment  1 application Nasal BID    Studies/Results: Dg Abd Acute W/chest  Result Date: 11/25/2017 CLINICAL DATA:  Post 1 day ERCP with stent placement. Lower abdominal pain and nausea. EXAM: DG ABDOMEN ACUTE W/ 1V CHEST COMPARISON:  ERCP 11/24/2017 FINDINGS: Chest radiograph demonstrates right basilar opacification likely small to moderate effusion with associated atelectasis although infection is possible. Cardiomediastinal silhouette is within normal. Remainder of the chest is unremarkable. Abdominopelvic images demonstrate nonobstructive bowel gas pattern. Moderate fecal  retention over the right colon. No free peritoneal air. Biliary stent over the right upper quadrant unchanged. Mild degenerate change of the spine and hips. IMPRESSION: Right base opacification likely small to moderate effusion with atelectasis. Infection less likely. Non obstructive bowel gas pattern with moderate fecal retention over the right colon. Biliary stent unchanged over the right upper quadrant. Electronically Signed   By: Elberta Fortisaniel  Boyle M.D.   On: 11/25/2017 11:23    Assessment: s/p Procedure(s): LAPAROSCOPIC CHOLECYSTECTOMY WITH  POSSIBLE INTRAOPERATIVE CHOLANGIOGRAM Patient Active Problem List   Diagnosis Date Noted  . Bile duct stone   . Gallstone pancreatitis 11/22/2017  . Hyperlipidemia 07/29/2015  . Family history of MI (myocardial infarction) 10/19/2014  . Hypothyroidism 01/05/2014  . Myasthenia gravis (HCC) 02/08/2010    Expected post op course  Plan: Advance diet  Will recheck after lunch.  If no bile in JP, will pull JP and pt would be ready for d/c from out standpoint  F/u and discharge instructions on AVS   LOS: 5 days     .Vanita PandaAlicia C Freeland Pracht, MD [facility], [place] [phone]   11/27/2017 8:50 AM

## 2017-11-27 NOTE — Discharge Summary (Signed)
Physician Discharge Summary  Christopher Blair:[id number] DOB: 1961-04-19 DOA: 11/22/2017  PCP: Shelva Majestic, MD  Admit date: 11/22/2017 Discharge date: 11/27/2017  Recommendations for Outpatient Follow-up:  1. Pt will need to follow up with PCP in 1-2 weeks post discharge 2. Please obtain BMP to evaluate electrolytes and kidney function, LFT to evaluation liver function tests 3. Please also check CBC to evaluate Hg and Hct levels  Discharge Diagnoses:  Active Problems:   Gallstone pancreatitis   Bile duct stone  Discharge Condition: Stable  Diet recommendation: Heart healthy diet discussed in details   History of present illness:  Patient is 57 year old male with known history of myasthenia gravis that has been stable and in remission for 20 years, presented to [facility] emergency department with main concern of 3 days duration of diffuse abdominal pain, sharp and intermittent, stabbing like at times,10/10 inseverity, radiating to the back, associated with nausea and nonbloody vomiting,unable to keep anything down. Patient also reports associated dyspnea, pain with deep breaths. Patient denies fevers or chills, no recent sick contacts or exposures, no similar events in the past. Patient denies chest pain, no specific urinary concerns.  In emergency department,patient noted to be in mild distress due to pain, vital signs notable for temperature 98.7, heart rate up to 123, blood pressure 127/98.Blood work notable for WBC 26.9, hemoglobin 17.2, ALT 259, AST 671 lipase 1100. Concern for gallstone pancreatitis. TRH asked to admit for further evaluation. ER doctor consulted with GI on call, recommendation is for stat MRCP,hold off on antibiotics.  Assessment & Plan:  Sepsis,Acute diffuse abdominal pain,transaminitis,pancreatitis - per MRCP, cholelithiasis with mild intra-and extrahepatic biliary duct dilatation - Edematous enlargement of the pancreatic head also  noted - ERCP done 02/16, stent placed, no stones noted and likely because pt already passed stones - pt now s/p lap chole, post op day #1, reports feeling better, wants to go home - JP pulled out  - lipase is WNL, LFT's trending down   Constipation - reports having BM yesterday and this AM  Elevated hemoglobin -From dehydration - resolved   Hypokalemia - supplemented   Hypothyroidism - resume home medical regimen   DVT prophylaxis: Lovenox SQ Code Status: Full code Family Communication: pt at bedside  Disposition Plan: home   Consultants:   GI   Surgery   Procedures:   MRCP 02/14  Antimicrobials:   Zosyn 02/14 --> 02/19  Procedures/Studies: Mr 3d Recon At Scanner  Result Date: 11/22/2017 CLINICAL DATA:  Abdominal pain and elevated LFTs. Clinical concern for gallstone pancreatitis. EXAM: MRI ABDOMEN WITHOUT AND WITH CONTRAST (INCLUDING MRCP) TECHNIQUE: Multiplanar multisequence MR imaging of the abdomen was performed both before and after the administration of intravenous contrast. Heavily T2-weighted images of the biliary and pancreatic ducts were obtained, and three-dimensional MRCP images were rendered by post processing. CONTRAST:  18mL MULTIHANCE GADOBENATE DIMEGLUMINE 529 MG/ML IV SOLN COMPARISON:  None. FINDINGS: Lower chest: Right base atelectasis with tiny right pleural effusion Hepatobiliary: No focal abnormality in the liver parenchyma. Mild intra and extrahepatic biliary duct dilatation. Extrahepatic common duct measures 8 mm diameter. Common bile duct as it enters the head of the pancreas measures 7 mm diameter. 2 separate and potentially 3 filling defects are identified in the distal common bile duct just proximal to the ampulla. These are well demonstrated on coronal image 26 of series 9. The most distal measures about 3 mm and the most proximal measures about 6 mm (image 26 series 5). Numerous  tiny gallstones are identified in the gallbladder lumen.  Pancreas: There is diffuse edema in and around the head of pancreas no dilatation of the main pancreatic duct. No evidence for fluid collection in or adjacent to the pancreas. Spleen:  No splenomegaly. No focal mass lesion. Adrenals/Urinary Tract: No adrenal nodule or mass. Right kidney unremarkable. 7 mm T1 hypointense, T2 intermediate to hyperintense lesion is identified in the medial parenchyma of the interpolar left kidney. After IV contrast administration, there is a question of enhancing mural nodule. Stomach/Bowel: Stomach is nondistended. No gastric wall thickening. No evidence of outlet obstruction. Duodenal wall thickening is evident. Possible duodenal diverticulum transverse segment. No small bowel or colonic dilatation within the visualized abdomen. Vascular/Lymphatic: No abdominal aortic aneurysm no abdominal lymphadenopathy Other:  No substantial intraperitoneal free fluid. Musculoskeletal: No abnormal marrow enhancement within the visualized bony anatomy. IMPRESSION: 1. Cholelithiasis with mild intra and extrahepatic biliary duct dilatation. 2, potentially 3, stones are seen in the distal common bile duct just proximal to the ampulla. 2. Edematous enlargement of the pancreatic head with edema in and around the pancreas and duodenum. No evidence for pancreatic pseudocyst. No dilatation of the main pancreatic duct. Electronically Signed   By: Kennith Center M.D.   On: 11/22/2017 19:31   Dg Ercp  Result Date: 11/24/2017 CLINICAL DATA:  Choledocholithiasis by MRCP EXAM: ERCP with endoscopic stent placement TECHNIQUE: Multiple spot images obtained with the fluoroscopic device and submitted for interpretation post-procedure. FLUOROSCOPY TIME:  Fluoroscopy Time:  1 minute 16 seconds Radiation Exposure Index (if provided by the fluoroscopic device): 15.26 mGy Number of Acquired Spot Images: 3 COMPARISON:  11/22/2017 FINDINGS: Spot fluoroscopic imaging during the ERCP. Mild diffuse biliary dilatation. No  definite stricture or obstruction pattern. No large filling defect. Lower CBD endoscopic stent inserted. IMPRESSION: Limited imaging during ERCP. Mild biliary dilatation. Distal CBD endoscopic stent placed. These images were submitted for radiologic interpretation only. Please see the procedural report for the amount of contrast and the fluoroscopy time utilized. Electronically Signed   By: Judie Petit.  Shick M.D.   On: 11/24/2017 08:56   Dg Abd Acute W/chest  Result Date: 11/25/2017 CLINICAL DATA:  Post 1 day ERCP with stent placement. Lower abdominal pain and nausea. EXAM: DG ABDOMEN ACUTE W/ 1V CHEST COMPARISON:  ERCP 11/24/2017 FINDINGS: Chest radiograph demonstrates right basilar opacification likely small to moderate effusion with associated atelectasis although infection is possible. Cardiomediastinal silhouette is within normal. Remainder of the chest is unremarkable. Abdominopelvic images demonstrate nonobstructive bowel gas pattern. Moderate fecal retention over the right colon. No free peritoneal air. Biliary stent over the right upper quadrant unchanged. Mild degenerate change of the spine and hips. IMPRESSION: Right base opacification likely small to moderate effusion with atelectasis. Infection less likely. Non obstructive bowel gas pattern with moderate fecal retention over the right colon. Biliary stent unchanged over the right upper quadrant. Electronically Signed   By: Elberta Fortis M.D.   On: 11/25/2017 11:23   Mr Abdomen Mrcp Vivien Rossetti Contast  Result Date: 11/22/2017 CLINICAL DATA:  Abdominal pain and elevated LFTs. Clinical concern for gallstone pancreatitis. EXAM: MRI ABDOMEN WITHOUT AND WITH CONTRAST (INCLUDING MRCP) TECHNIQUE: Multiplanar multisequence MR imaging of the abdomen was performed both before and after the administration of intravenous contrast. Heavily T2-weighted images of the biliary and pancreatic ducts were obtained, and three-dimensional MRCP images were rendered by post  processing. CONTRAST:  18mL MULTIHANCE GADOBENATE DIMEGLUMINE 529 MG/ML IV SOLN COMPARISON:  None. FINDINGS: Lower chest: Right base atelectasis  with tiny right pleural effusion Hepatobiliary: No focal abnormality in the liver parenchyma. Mild intra and extrahepatic biliary duct dilatation. Extrahepatic common duct measures 8 mm diameter. Common bile duct as it enters the head of the pancreas measures 7 mm diameter. 2 separate and potentially 3 filling defects are identified in the distal common bile duct just proximal to the ampulla. These are well demonstrated on coronal image 26 of series 9. The most distal measures about 3 mm and the most proximal measures about 6 mm (image 26 series 5). Numerous tiny gallstones are identified in the gallbladder lumen. Pancreas: There is diffuse edema in and around the head of pancreas no dilatation of the main pancreatic duct. No evidence for fluid collection in or adjacent to the pancreas. Spleen:  No splenomegaly. No focal mass lesion. Adrenals/Urinary Tract: No adrenal nodule or mass. Right kidney unremarkable. 7 mm T1 hypointense, T2 intermediate to hyperintense lesion is identified in the medial parenchyma of the interpolar left kidney. After IV contrast administration, there is a question of enhancing mural nodule. Stomach/Bowel: Stomach is nondistended. No gastric wall thickening. No evidence of outlet obstruction. Duodenal wall thickening is evident. Possible duodenal diverticulum transverse segment. No small bowel or colonic dilatation within the visualized abdomen. Vascular/Lymphatic: No abdominal aortic aneurysm no abdominal lymphadenopathy Other:  No substantial intraperitoneal free fluid. Musculoskeletal: No abnormal marrow enhancement within the visualized bony anatomy. IMPRESSION: 1. Cholelithiasis with mild intra and extrahepatic biliary duct dilatation. 2, potentially 3, stones are seen in the distal common bile duct just proximal to the ampulla. 2. Edematous  enlargement of the pancreatic head with edema in and around the pancreas and duodenum. No evidence for pancreatic pseudocyst. No dilatation of the main pancreatic duct. Electronically Signed   By: Kennith Center M.D.   On: 11/22/2017 19:31    Discharge Exam: Vitals:   11/27/17 0639 11/27/17 0900  BP: 127/85 139/89  Pulse: 67 64  Resp: 15   Temp: 97.6 F (36.4 C) 98.1 F (36.7 C)  SpO2: 94% 95%   Vitals:   11/26/17 1435 11/26/17 2129 11/27/17 0639 11/27/17 0900  BP: (!) 145/82 134/82 127/85 139/89  Pulse: 83 68 67 64  Resp: 18 16 15    Temp: 97.8 F (36.6 C) 98.8 F (37.1 C) 97.6 F (36.4 C) 98.1 F (36.7 C)  TempSrc:  Oral Oral Oral  SpO2: 97% 95% 94% 95%  Weight:      Height:        General: Pt is alert, follows commands appropriately, not in acute distress Cardiovascular: Regular rate and rhythm, S1/S2 +, no murmurs, no rubs, no gallops Respiratory: Clear to auscultation bilaterally, no wheezing, no crackles, no rhonchi Abdominal: Soft, non tender, non distended, bowel sounds +, no guarding Extremities: no edema, no cyanosis, pulses palpable bilaterally DP and PT Neuro: Grossly nonfocal  Discharge Instructions  Discharge Instructions    Diet - low sodium heart healthy   Complete by:  As directed    Increase activity slowly   Complete by:  As directed      Allergies as of 11/27/2017      Reactions   Mucinex [guaifenesin Er] Other (See Comments)   vomiting      Medication List    STOP taking these medications   ibuprofen 200 MG tablet Commonly known as:  ADVIL,MOTRIN     TAKE these medications   aspirin 81 MG tablet Take 81 mg by mouth daily. Reported on 12/27/2015   levothyroxine 100 MCG tablet Commonly  known as:  SYNTHROID, LEVOTHROID TAKE 1 TABLET BY MOUTH ONCE DAILY   oxyCODONE 5 MG immediate release tablet Commonly known as:  Oxy IR/ROXICODONE Take 1 tablet (5 mg total) by mouth every 4 (four) hours as needed for severe pain.      Follow-up  Information    [facility] Follow up on 12/11/2017.   Specialty:  General Surgery Why:  Your appointment is at 9:15 AM. Be at the office 30 minutes early for check in.  Bring photo ID and insurance information. Contact information: [address] [place] [address] [phone]        Shelva Majestic, MD Follow up.   Specialty:  Family Medicine Contact information: [address] [place] [address] [phone]            The results of significant diagnostics from this hospitalization (including imaging, microbiology, ancillary and laboratory) are listed below for reference.     Microbiology: Recent Results (from the past 240 hour(s))  Culture, blood (x 2)     Status: None (Preliminary result)   Collection Time: 11/22/17  7:35 PM  Result Value Ref Range Status   Specimen Description   Final    BLOOD LEFT ANTECUBITAL Performed at [facility], [address], [place], [place] [address]    Special Requests   Final    BOTTLES DRAWN AEROBIC AND ANAEROBIC Blood Culture adequate volume Performed at [facility], [address], [place], [place] [address]    Culture   Final    NO GROWTH 4 DAYS Performed at [facility], [address] [place], [place] [address]    Report Status PENDING  Incomplete  Culture, blood (x 2)     Status: None (Preliminary result)   Collection Time: 11/22/17  7:44 PM  Result Value Ref Range Status   Specimen Description   Final    BLOOD LEFT HAND Performed at [facility], [address], [place], [place] [address]    Special Requests   Final    BOTTLES DRAWN AEROBIC AND ANAEROBIC Blood Culture adequate volume Performed at [facility], [address], [place], [place] [address]    Culture   Final    NO GROWTH 4 DAYS Performed at [facility], [address] [place], [place] [address]    Report Status PENDING   Incomplete  Surgical PCR screen     Status: Abnormal   Collection Time: 11/22/17 10:56 PM  Result Value Ref Range Status   MRSA, PCR NEGATIVE NEGATIVE Final   Staphylococcus aureus POSITIVE (A) NEGATIVE Final    Comment: (NOTE) The Xpert SA Assay (FDA approved for NASAL specimens in patients 51 years of age and older), is one component of a comprehensive surveillance program. It is not intended to diagnose infection nor to guide or monitor treatment. Performed at [facility], [address], [place], [place] [address]      Labs: Basic Metabolic Panel: Recent Labs  Lab 11/23/17 0352 11/24/17 0415 11/25/17 0424 11/26/17 0459 11/27/17 0418  NA 139 140 142 139 138  K 3.5 3.5 3.8 3.2* 3.9  CL 106 108 110 109 107  CO2 22 22 26 23 23   GLUCOSE 116* 83 110* 103* 148*  BUN 19 14 16 13 14   CREATININE 1.03 0.97 0.81 0.80 0.84  CALCIUM 8.2* 8.0* 7.8* 7.6* 7.8*   Liver Function Tests: Recent Labs  Lab  11/23/17 0352 11/24/17 0415 11/25/17 0424 11/26/17 0459 11/27/17 0418  AST 122* 47* 30 26 71*  ALT 434* 217* 135* 93* 104*  ALKPHOS 330* 227* 191* 165* 156*  BILITOT 3.8* 2.7* 1.7* 1.5* 1.0  PROT 6.7 5.9* 5.5* 5.5* 5.7*  ALBUMIN 3.3* 2.6* 2.3* 2.1* 2.1*   Recent Labs  Lab 11/22/17 1329 11/23/17 0352 11/24/17 0415 11/25/17 0424 11/26/17 0459  LIPASE 1,167* 136* 54* 29 24   No results for input(s): AMMONIA in the last 168 hours. CBC: Recent Labs  Lab 11/23/17 0352 11/24/17 0415 11/25/17 0424 11/26/17 0459 11/27/17 0418  WBC 23.7* 15.5* 12.2* 11.0* 10.9*  HGB 14.6 13.0 12.6* 11.5* 11.5*  HCT 43.1 38.2* 37.5* 34.2* 34.0*  MCV 92.7 94.3 94.0 92.9 92.4  PLT 232 190 207 207 257   CBG: Recent Labs  Lab 11/24/17 2100  GLUCAP 116*   SIGNED: Time coordinating discharge: 50 minutes  Debbora PrestoIskra Magick-Argenis Kumari, MD  [facility] 11/27/2017, 10:25 AM Pager [phone]  If 7PM-7AM, please contact night-coverage [email or web address] Password [email or web address]

## 2017-11-27 NOTE — Progress Notes (Signed)
Pt discharged to home, discharge instruction reviewed with patient, patient acknowledged understanding.

## 2017-11-27 NOTE — Discharge Instructions (Signed)
Laparoscopic Cholecystectomy, Care After °This sheet gives you information about how to care for yourself after your procedure. Your health care provider may also give you more specific instructions. If you have problems or questions, contact your health care provider. °What can I expect after the procedure? °After the procedure, it is common to have: °· Pain at your incision sites. You will be given medicines to control this pain. °· Mild nausea or vomiting. °· Bloating and possible shoulder pain from the air-like gas that was used during the procedure. ° °Follow these instructions at home: °Incision care ° °· Follow instructions from your health care provider about how to take care of your incisions. Make sure you: °? Wash your hands with soap and water before you change your bandage (dressing). If soap and water are not available, use hand sanitizer. °? Change your dressing as told by your health care provider. °? Leave stitches (sutures), skin glue, or adhesive strips in place. These skin closures may need to be in place for 2 weeks or longer. If adhesive strip edges start to loosen and curl up, you may trim the loose edges. Do not remove adhesive strips completely unless your health care provider tells you to do that. °· Do not take baths, swim, or use a hot tub until your health care provider approves. Ask your health care provider if you can take showers. You may only be allowed to take sponge baths for bathing. °· Check your incision area every day for signs of infection. Check for: °? More redness, swelling, or pain. °? More fluid or blood. °? Warmth. °? Pus or a bad smell. °Activity °· Do not drive or use heavy machinery while taking prescription pain medicine. °· Do not lift anything that is heavier than 10 lb (4.5 kg) until your health care provider approves. °· Do not play contact sports until your health care provider approves. °· Do not drive for 24 hours if you were given a medicine to help you relax  (sedative). °· Rest as needed. Do not return to work or school until your health care provider approves. °General instructions °· Take over-the-counter and prescription medicines only as told by your health care provider. °· To prevent or treat constipation while you are taking prescription pain medicine, your health care provider may recommend that you: °? Drink enough fluid to keep your urine clear or pale yellow. °? Take over-the-counter or prescription medicines. °? Eat foods that are high in fiber, such as fresh fruits and vegetables, whole grains, and beans. °? Limit foods that are high in fat and processed sugars, such as fried and sweet foods. °Contact a health care provider if: °· You develop a rash. °· You have more redness, swelling, or pain around your incisions. °· You have more fluid or blood coming from your incisions. °· Your incisions feel warm to the touch. °· You have pus or a bad smell coming from your incisions. °· You have a fever. °· One or more of your incisions breaks open. °Get help right away if: °· You have trouble breathing. °· You have chest pain. °· You have increasing pain in your shoulders. °· You faint or feel dizzy when you stand. °· You have severe pain in your abdomen. °· You have nausea or vomiting that lasts for more than one day. °· You have leg pain. °This information is not intended to replace advice given to you by your health care provider. Make sure you discuss any questions you   have with your health care provider. °Document Released: 09/25/2005 Document Revised: 04/15/2016 Document Reviewed: 03/13/2016 °Elsevier Interactive Patient Education © 2018 [employer] ° ° °CCS ______[facility] °LAPAROSCOPIC SURGERY: POST OP INSTRUCTIONS °Always review your discharge instruction sheet given to you by the facility where your surgery was performed. °IF YOU HAVE DISABILITY OR FAMILY LEAVE FORMS, YOU MUST BRING THEM TO THE OFFICE FOR PROCESSING.   °DO NOT GIVE  THEM TO YOUR DOCTOR. ° °1. A prescription for pain medication may be given to you upon discharge.  Take your pain medication as prescribed, if needed.  If narcotic pain medicine is not needed, then you may take acetaminophen (Tylenol) or ibuprofen (Advil) as needed. °2. Take your usually prescribed medications unless otherwise directed. °3. If you need a refill on your pain medication, please contact your pharmacy.  They will contact our office to request authorization. Prescriptions will not be filled after 5pm or on week-ends. °4. You should follow a light diet the first few days after arrival home, such as soup and crackers, etc.  Be sure to include lots of fluids daily. °5. Most patients will experience some swelling and bruising in the area of the incisions.  Ice packs will help.  Swelling and bruising can take several days to resolve.  °6. It is common to experience some constipation if taking pain medication after surgery.  Increasing fluid intake and taking a stool softener (such as Colace) will usually help or prevent this problem from occurring.  A mild laxative (Milk of Magnesia or Miralax) should be taken according to package instructions if there are no bowel movements after 48 hours. °7. Unless discharge instructions indicate otherwise, you may remove your bandages 24-48 hours after surgery, and you may shower at that time.  You may have steri-strips (small skin tapes) in place directly over the incision.  These strips should be left on the skin for 7-10 days.  If your surgeon used skin glue on the incision, you may shower in 24 hours.  The glue will flake off over the next 2-3 weeks.  Any sutures or staples will be removed at the office during your follow-up visit. °8. ACTIVITIES:  You may resume regular (light) daily activities beginning the next day--such as daily self-care, walking, climbing stairs--gradually increasing activities as tolerated.  You may have sexual intercourse when it is  comfortable.  Refrain from any heavy lifting or straining until approved by your doctor. °a. You may drive when you are no longer taking prescription pain medication, you can comfortably wear a seatbelt, and you can safely maneuver your car and apply brakes. °b. RETURN TO WORK:  __________________________________________________________ °9. You should see your doctor in the office for a follow-up appointment approximately 2-3 weeks after your surgery.  Make sure that you call for this appointment within a day or two after you arrive home to insure a convenient appointment time. °10. OTHER INSTRUCTIONS: __________________________________________________________________________________________________________________________ __________________________________________________________________________________________________________________________ °WHEN TO CALL YOUR DOCTOR: °1. Fever over 101.0 °2. Inability to urinate °3. Continued bleeding from incision. °4. Increased pain, redness, or drainage from the incision. °5. Increasing abdominal pain ° °The clinic staff is available to answer your questions during regular business hours.  Please don’t hesitate to call and ask to speak to one of the nurses for clinical concerns.  If you have a medical emergency, go to the nearest emergency room or call 911.  A surgeon from [facility] is always on call at the hospital. °[address], [address], [place], [place]  [address] ? [address] [address], [place], [place]   [address] °[phone] ? [phone] ? FAX [phone] °Web site: [email or web address] ° °

## 2017-11-27 NOTE — Plan of Care (Signed)
  Progressing Health Behavior/Discharge Planning: Ability to manage health-related needs will improve 11/27/2017 1325 - Progressing by Charmian MuffPickett, Mehek Grega R, RN Clinical Measurements: Ability to maintain clinical measurements within normal limits will improve 11/27/2017 1325 - Progressing by Charmian MuffPickett, Gaia Gullikson R, RN Diagnostic test results will improve 11/27/2017 1325 - Progressing by Charmian MuffPickett, Anaysha Andre R, RN Respiratory complications will improve 11/27/2017 1325 - Progressing by Charmian MuffPickett, Minha Fulco R, RN Cardiovascular complication will be avoided 11/27/2017 1325 - Progressing by Charmian MuffPickett, Sheli Dorin R, RN Nutrition: Adequate nutrition will be maintained 11/27/2017 1325 - Progressing by Charmian MuffPickett, Leevon Upperman R, RN

## 2017-11-27 NOTE — Progress Notes (Signed)
Medications administered by student RN [phone] with supervision of Clinical Instructor Orvil FeilAbbie Nidia Grogan MSN, RN-BC or patient's assigned RN.

## 2017-11-28 ENCOUNTER — Telehealth: Payer: Self-pay | Admitting: *Deleted

## 2017-11-28 NOTE — Telephone Encounter (Signed)
Per chart review: Admit date: 11/22/2017 Discharge date: 11/27/2017  Recommendations for Outpatient Follow-up:  1. Pt will need to follow up with PCP in 1-2 weeks post discharge 2. Please obtain BMP to evaluate electrolytes and kidney function, LFT to evaluation liver function tests 3. Please also check CBC to evaluate Hg and Hct levels  Discharge Diagnoses:  Active Problems:   Gallstone pancreatitis   Bile duct stone  Discharge Condition: Stable  Diet recommendation: Heart healthy diet discussed in details   ______________________________________________________________________________ Transition Care Management Follow-up Telephone Call   Date discharged? 11/27/17   How have you been since you were released from the hospital? "ok"    Do you understand why you were in the hospital? yes   Do you understand the discharge instructions? yes   Where were you discharged to? Home   Items Reviewed:  Medications reviewed: yes  Allergies reviewed: yes  Dietary changes reviewed: yes  Referrals reviewed: yes   Functional Questionnaire:   Activities of Daily Living (ADLs):   He states they are independent in the following: ambulation, bathing and hygiene, feeding, continence, grooming, toileting and dressing States they require assistance with the following: none   Any transportation issues/concerns?: yes. Patient states he cannot make an appointment until he gets transportation set up.    Any patient concerns? no   Confirmed importance and date/time of follow-up visits scheduled I explained that he needs to have a follow up visit within the next 2 weeks and get repeat blood work. He states that he will talk to friends/family for transportation and then call to schedule. I explained that Dr Durene CalHunter may be out due to a new baby and he may have to see another provider in the office. Patient stated understanding.   Confirmed with patient if condition begins to worsen  call PCP or go to the ER.  Patient was given the office number and encouraged to call back with question or concerns.  : yes

## 2017-11-29 NOTE — Telephone Encounter (Signed)
noted thanks  

## 2017-12-04 ENCOUNTER — Ambulatory Visit: Payer: BLUE CROSS/BLUE SHIELD | Admitting: Family Medicine

## 2017-12-04 ENCOUNTER — Encounter: Payer: Self-pay | Admitting: Family Medicine

## 2017-12-04 VITALS — BP 100/68 | HR 79 | Temp 97.8°F | Ht 68.0 in | Wt 190.8 lb

## 2017-12-04 DIAGNOSIS — E039 Hypothyroidism, unspecified: Secondary | ICD-10-CM

## 2017-12-04 DIAGNOSIS — K851 Biliary acute pancreatitis without necrosis or infection: Secondary | ICD-10-CM | POA: Diagnosis not present

## 2017-12-04 DIAGNOSIS — G7000 Myasthenia gravis without (acute) exacerbation: Secondary | ICD-10-CM | POA: Diagnosis not present

## 2017-12-04 DIAGNOSIS — R1011 Right upper quadrant pain: Secondary | ICD-10-CM | POA: Diagnosis not present

## 2017-12-04 LAB — COMPREHENSIVE METABOLIC PANEL
ALBUMIN: 3.3 g/dL — AB (ref 3.5–5.2)
ALK PHOS: 181 U/L — AB (ref 39–117)
ALT: 33 U/L (ref 0–53)
AST: 22 U/L (ref 0–37)
BUN: 14 mg/dL (ref 6–23)
CO2: 28 mEq/L (ref 19–32)
CREATININE: 0.94 mg/dL (ref 0.40–1.50)
Calcium: 9.4 mg/dL (ref 8.4–10.5)
Chloride: 100 mEq/L (ref 96–112)
GFR: 87.88 mL/min (ref >60.00)
Glucose, Bld: 104 mg/dL — ABNORMAL HIGH (ref 70–99)
Potassium: 4.3 mEq/L (ref 3.5–5.1)
SODIUM: 135 meq/L (ref 135–145)
TOTAL PROTEIN: 7.1 g/dL (ref 6.0–8.3)
Total Bilirubin: 0.8 mg/dL (ref 0.2–1.2)

## 2017-12-04 LAB — CBC WITH DIFFERENTIAL/PLATELET
Basophils Absolute: 0.1 10*3/uL (ref 0.0–0.1)
Basophils Relative: 0.6 % (ref 0.0–3.0)
EOS ABS: 0.5 10*3/uL (ref 0.0–0.7)
EOS PCT: 3.4 % (ref 0.0–5.0)
HCT: 40.4 % (ref 39.0–52.0)
HEMOGLOBIN: 13.6 g/dL (ref 13.0–17.0)
Lymphocytes Relative: 8.9 % — ABNORMAL LOW (ref 12.0–46.0)
Lymphs Abs: 1.2 10*3/uL (ref 0.7–4.0)
MCHC: 33.8 g/dL (ref 30.0–36.0)
MCV: 91.9 fl (ref 78.0–100.0)
MONO ABS: 1 10*3/uL (ref 0.1–1.0)
Monocytes Relative: 7.6 % (ref 3.0–12.0)
Neutro Abs: 10.6 10*3/uL — ABNORMAL HIGH (ref 1.4–7.7)
Neutrophils Relative %: 79.5 % — ABNORMAL HIGH (ref 43.0–77.0)
Platelets: 487 10*3/uL — ABNORMAL HIGH (ref 150.0–400.0)
RBC: 4.4 Mil/uL (ref 4.22–5.81)
RDW: 13.4 % (ref 11.5–15.5)
WBC: 13.3 10*3/uL — AB (ref 4.0–10.5)

## 2017-12-04 NOTE — Assessment & Plan Note (Signed)
S: Patient admitted to hospital on 11/22/17 with severe abdominal pain with ultimate origin of  gallstone pancreatitis. Initially in ER had HR to 123, WBC 26.9k, hemoglobin 17.2 (dehydration) , ALT 259, ast 671 and lipase of 1100 obviously concerning for gallstone pancreatitis.   MRCP with cholelithiasis and mild intra and extra hepatic biliary duct dilation.  Follow up ERCP 2/16 with stenting but had passed stone by time of procedure.   Ultimately, He had laparoscopic cholecystectomy before end of  hospitalization. JP drain removed before he left the hospital. He was advised PCP follow up in 1-2 weeks. adivsed CMP and CBC outpatient.   For his chronic issues- he was on levothyroxine 100 mcg in the hospital.   Today, he states having pain near incision sites.He was given oxycodone #30 for pain control. Oxycodone is helping for the pain about once a night but cant get to sleep- but at least pain improves. Tylenol can help get him to sleep though so he is thankful for combo. Still waking up with cold sweats sometimes. Most of his pain is at the incision sites from the MRCP. He denies fever.    Now reflecting back wonders if he was passing stones prior to this as has had periods of abdominal pain and nausea lasting 2-4 days then resolving on its own over last few years. Wife is feeding him well- high fiber and hasnt had constiptaion yet.   Sees general surgery march 5th and is supposed to follow up with GI to remove stent  A/P: Gallstone pancreatitis has resolved at this point Lab Results  Component Value Date   LIPASE 24 11/26/2017  We will get repeat cmp, cbc today. Discussed follow up with surgeon if he is in fact febrile at night- especially with his abdominal pain for consideration of potential CT scan.

## 2017-12-04 NOTE — Progress Notes (Signed)
Subjective:  Christopher Blair is a 57 y.o. year old very pleasant male patient who presents for/with See problem oriented charting ROS- continued abdominal pain- only slowing improving. No nausea or vmiting. Firm bowel movements but still daily. No edema. Passing gas.    Past Medical History-  Patient Active Problem List   Diagnosis Date Noted  . Hyperlipidemia 07/29/2015    Priority: Medium  . Family history of MI (myocardial infarction) 10/19/2014    Priority: Medium  . Hypothyroidism 01/05/2014    Priority: Medium  . Myasthenia gravis (HCC) 02/08/2010    Priority: Low  . Gallstone pancreatitis 11/22/2017    Medications- reviewed and updated Current Outpatient Medications  Medication Sig Dispense Refill  . acetaminophen (TYLENOL) 500 MG tablet Take 500 mg by mouth every 8 (eight) hours as needed.    Marland Kitchen. aspirin 81 MG tablet Take 81 mg by mouth daily. Reported on 12/27/2015    . levothyroxine (SYNTHROID, LEVOTHROID) 100 MCG tablet TAKE 1 TABLET BY MOUTH ONCE DAILY 90 tablet 1  . oxyCODONE (OXY IR/ROXICODONE) 5 MG immediate release tablet Take 1 tablet (5 mg total) by mouth every 4 (four) hours as needed for severe pain. 30 tablet 0   No current facility-administered medications for this visit.     Objective: BP 100/68 (BP Location: Left Arm, Patient Position: Sitting, Cuff Size: Large)   Pulse 79   Temp 97.8 F (36.6 C) (Oral)   Ht 5\' 8"  (1.727 m)   Wt 190 lb 12.8 oz (86.5 kg)   SpO2 97%   BMI 29.01 kg/m  Gen: NAD, appears fatigued CV: RRR no murmurs rubs or gallops Lungs: CTAB no crackles, wheeze, rhonchi Abdomen: soft/tender and somewhat rigid on right side of abdomen- mostly in RUQ with lesser pain in RLQ/normal bowel sounds. No rebound or guarding.  Ext: no edema Skin: warm, dry  Assessment/Plan:  Hypothyroidism Stable on levothyroxine 88 mcg in hospital Lab Results  Component Value Date   TSH 2.073 11/23/2017     Myasthenia gravis History of myasthenia  gravis. He seems to get very fatigued when sick- he is certainly having some lingering fatigue from cholecystectomy but likely primarily post surgical  Gallstone pancreatitis S: Patient admitted to hospital on 11/22/17 with severe abdominal pain with ultimate origin of  gallstone pancreatitis. Initially in ER had HR to 123, WBC 26.9k, hemoglobin 17.2 (dehydration) , ALT 259, ast 671 and lipase of 1100 obviously concerning for gallstone pancreatitis.   MRCP with cholelithiasis and mild intra and extra hepatic biliary duct dilation.  Follow up ERCP 2/16 with stenting but had passed stone by time of procedure.   Ultimately, He had laparoscopic cholecystectomy before end of  hospitalization. JP drain removed before he left the hospital. He was advised PCP follow up in 1-2 weeks. adivsed CMP and CBC outpatient.   For his chronic issues- he was on levothyroxine 100 mcg in the hospital.   Today, he states having pain near incision sites.He was given oxycodone #30 for pain control. Oxycodone is helping for the pain about once a night but cant get to sleep- but at least pain improves. Tylenol can help get him to sleep though so he is thankful for combo. Still waking up with cold sweats sometimes. Most of his pain is at the incision sites from the MRCP. He denies fever.    Now reflecting back wonders if he was passing stones prior to this as has had periods of abdominal pain and nausea lasting 2-4 days then  resolving on its own over last few years. Wife is feeding him well- high fiber and hasnt had constiptaion yet.   Sees general surgery march 5th and is supposed to follow up with GI to remove stent  A/P: Gallstone pancreatitis has resolved at this point Lab Results  Component Value Date   LIPASE 24 11/26/2017  We will get repeat cmp, cbc today. Discussed follow up with surgeon if he is in fact febrile at night- especially with his abdominal pain for consideration of potential CT scan.   Lab/Order  associations: RUQ abdominal pain - Plan: CBC with Differential/Platelet, Comprehensive metabolic panel  Time Stamp The duration of face-to-face time during this visit was greater than 25 minutes. Greater than 50% of this time was spent in counseling, explanation of diagnosis, planning of further management, and/or coordination of care including reviewing and summarizing records, counseling on follow up precautions with [place] or surgeon, discussing bowel regimen while on pain medication. .   Return precautions advised.  [name], MD

## 2017-12-04 NOTE — Assessment & Plan Note (Signed)
History of myasthenia gravis. He seems to get very fatigued when sick- he is certainly having some lingering fatigue from cholecystectomy but likely primarily post surgical

## 2017-12-04 NOTE — Patient Instructions (Addendum)
Please stop by lab before you go  Please make sure you are not having fever at night when you have the chills. If you are having fever- I want you to update your general surgeon- they would likely want to see you sooner. I would want to hold off on scanning unless they consult.   My hope is within 6 weeks you feel back to yourself but you really had a tough go of it- I would strongly consider staying out of work at least until you see the surgeons

## 2017-12-04 NOTE — Assessment & Plan Note (Signed)
Stable on levothyroxine 88 mcg in hospital Lab Results  Component Value Date   TSH 2.073 11/23/2017

## 2017-12-05 ENCOUNTER — Telehealth: Payer: Self-pay | Admitting: Internal Medicine

## 2017-12-05 NOTE — Telephone Encounter (Signed)
Per ERCP procedure report from 11/24/17 patient is to follow up with [facility] to determine the next steps with stent.  Patient notified of next steps and [facility]

## 2017-12-11 ENCOUNTER — Other Ambulatory Visit: Payer: Self-pay | Admitting: Gastroenterology

## 2017-12-11 DIAGNOSIS — Z8719 Personal history of other diseases of the digestive system: Secondary | ICD-10-CM

## 2017-12-11 LAB — BASIC METABOLIC PANEL
BUN: 15 (ref 4–21)
Creatinine: 1 (ref 0.6–1.3)
Glucose: 99
POTASSIUM: 5 (ref 3.4–5.3)
SODIUM: 138 (ref 137–147)

## 2017-12-11 LAB — HEPATIC FUNCTION PANEL
ALK PHOS: 123 (ref 25–125)
ALT: 22 (ref 10–40)
AST: 20 (ref 14–40)

## 2017-12-11 LAB — CBC AND DIFFERENTIAL
HEMATOCRIT: 42 (ref 41–53)
HEMOGLOBIN: 14.3 (ref 13.5–17.5)
Platelets: 511 — AB (ref 150–399)
WBC: 7.2

## 2017-12-17 ENCOUNTER — Ambulatory Visit
Admission: RE | Admit: 2017-12-17 | Discharge: 2017-12-17 | Disposition: A | Discharge status: Home / Self Care | Payer: BLUE CROSS/BLUE SHIELD | Source: Ambulatory Visit | Attending: Gastroenterology | Admitting: Gastroenterology

## 2017-12-17 DIAGNOSIS — K861 Other chronic pancreatitis: Secondary | ICD-10-CM | POA: Diagnosis not present

## 2017-12-17 DIAGNOSIS — Z8719 Personal history of other diseases of the digestive system: Secondary | ICD-10-CM

## 2017-12-17 MED ORDER — IOPAMIDOL (ISOVUE-300) INJECTION 61%
100.0000 mL | Freq: Once | INTRAVENOUS | Status: AC | PRN
  Administered 2017-12-17: 100 mL via INTRAVENOUS

## 2017-12-23 ENCOUNTER — Other Ambulatory Visit: Payer: Self-pay | Admitting: Family Medicine

## 2017-12-28 ENCOUNTER — Encounter: Payer: Self-pay | Admitting: Family Medicine

## 2018-02-06 ENCOUNTER — Ambulatory Visit: Payer: BLUE CROSS/BLUE SHIELD | Admitting: Family Medicine

## 2018-02-06 ENCOUNTER — Ambulatory Visit: Payer: Self-pay

## 2018-02-06 ENCOUNTER — Encounter: Payer: Self-pay | Admitting: Family Medicine

## 2018-02-06 VITALS — BP 118/80 | HR 82 | Temp 98.3°F | Ht 68.0 in | Wt 192.8 lb

## 2018-02-06 DIAGNOSIS — J329 Chronic sinusitis, unspecified: Secondary | ICD-10-CM

## 2018-02-06 DIAGNOSIS — B9689 Other specified bacterial agents as the cause of diseases classified elsewhere: Secondary | ICD-10-CM | POA: Diagnosis not present

## 2018-02-06 DIAGNOSIS — J301 Allergic rhinitis due to pollen: Secondary | ICD-10-CM | POA: Diagnosis not present

## 2018-02-06 MED ORDER — AMOXICILLIN-POT CLAVULANATE 875-125 MG PO TABS: 1.0000 | ORAL_TABLET | Freq: Two times a day (BID) | ORAL | 0 refills | Status: AC

## 2018-02-06 MED ORDER — FLUTICASONE PROPIONATE 50 MCG/ACT NA SUSP: 2.0000 | Freq: Every day | NASAL | 3 refills | Status: DC

## 2018-02-06 NOTE — Telephone Encounter (Signed)
See note

## 2018-02-06 NOTE — Telephone Encounter (Signed)
Noted. Patient being seen by Dr. Durene Cal currently

## 2018-02-06 NOTE — Telephone Encounter (Signed)
Pt calling with 1 month h/o sinusitis. Pt c/o under eye pain, nasal congestion and occasional productive cough. Pt states that he was unable to sleep due to the drainage and cough. Pt given appt for todady with PCP Reason for Disposition . [1] Sinus congestion (pressure, fullness) AND [2] present > 10 days . Lots of coughing  Answer Assessment - Initial Assessment Questions 1. LOCATION: "Where does it hurt?"      Under eyes 2. ONSET: "When did the sinus pain start?"  (e.g., hours, days)      1 month ago 3. SEVERITY: "How bad is the pain?"   (Scale 1-10; mild, moderate or severe)   - MILD (1-3): doesn't interfere with normal activities    - MODERATE (4-7): interferes with normal activities (e.g., work or school) or awakens from sleep   - SEVERE (8-10): excruciating pain and patient unable to do any normal activities        5/10 4. RECURRENT SYMPTOM: "Have you ever had sinus problems before?" If so, ask: "When was the last time?" and "What happened that time?"      Allergies had coughing episode 5. NASAL CONGESTION: "Is the nose blocked?" If so, ask, "Can you open it or must you breathe through the mouth?"     No  6. NASAL DISCHARGE: "Do you have discharge from your nose?" If so ask, "What color?"     Yes- clear 7. FEVER: "Do you have a fever?" If so, ask: "What is it, how was it measured, and when did it start?"      no 8. OTHER SYMPTOMS: "Do you have any other symptoms?" (e.g., sore throat, cough, earache, difficulty breathing)     Cough (productive white), sore throat, earache- Coughins keeping pt up at night 9. PREGNANCY: "Is there any chance you are pregnant?" "When was your last menstrual period?"     n/a  Protocols used: SINUS PAIN OR CONGESTION-A-AH

## 2018-02-06 NOTE — Patient Instructions (Addendum)
Health Maintenance Due  Topic Date Due  . COLONOSCOPY - Our team will call [facility] to get the records.  10/23/2010   Sinsusitis and seasonal allergies Bacterial based on: Symptoms >10 days, double sickening, or severe symptoms in first 3 days  This seemed to start out with his allergies.  He will continue his over-the-counter antihistamine such as Claritin or Allegra daily.  After 7 days of Augmentin he will start Flonase for allergic component as long as this does not cause worsening nosebleeds  Treatment: -considered steroid: we opted out-he is concerned about weight gain -Antibiotic indicated: yes  Finally, we reviewed reasons to return to care including if symptoms worsen or persist or new concerns arise (particularly fever or shortness of breath)  Meds ordered this encounter  Medications  . amoxicillin-clavulanate (AUGMENTIN) 875-125 MG tablet    Sig: Take 1 tablet by mouth 2 (two) times daily for 7 days.    Dispense:  14 tablet    Refill:  0

## 2018-02-06 NOTE — Progress Notes (Signed)
PCP: Shelva Majestic, MD  Subjective:  Christopher Blair is a 57 y.o. year old very pleasant male patient who presents with sinusitis symptoms including nasal congestion, sinus tenderness -other symptoms include: For last month allergies have been inflamed and has been taking Claritin.  Despite this about 14 days ago his maxillary sinuses became inflamed and he has had more yellow and green discharge from his nose.  He has had some very slight nosebleeds from all the congestion.  He does not seem to be improving with time as hoped -sick contacts/travel/risks: denies flu exposure.  -Hx of: allergies- yes  ROS-denies fever, SOB, NVD, tooth pain  Pertinent Past Medical History-  Patient Active Problem List   Diagnosis Date Noted  . Hyperlipidemia 07/29/2015    Priority: Medium  . Family history of MI (myocardial infarction) 10/19/2014    Priority: Medium  . Hypothyroidism 01/05/2014    Priority: Medium  . Myasthenia gravis (HCC) 02/08/2010    Priority: Low  . Gallstone pancreatitis 11/22/2017    Medications- reviewed  Current Outpatient Medications  Medication Sig Dispense Refill  . acetaminophen (TYLENOL) 500 MG tablet Take 500 mg by mouth every 8 (eight) hours as needed.    Marland Kitchen aspirin 81 MG tablet Take 81 mg by mouth daily. Reported on 12/27/2015    . levothyroxine (SYNTHROID, LEVOTHROID) 100 MCG tablet TAKE 1 TABLET BY MOUTH ONCE DAILY 90 tablet 1   No current facility-administered medications for this visit.     Objective: BP 118/80 (BP Location: Left Arm, Patient Position: Sitting, Cuff Size: Normal)   Pulse 82   Temp 98.3 F (36.8 C) (Oral)   Ht  (1.727 m)   Wt 192 lb 12.8 oz (87.5 kg)   SpO2 95%   BMI 29.32 kg/m  Gen: NAD, resting comfortably HEENT: Turbinates erythematous with yellow/green drainage, TM normal, pharynx mildly erythematous with no tonsilar exudate or edema, maxillary sinus tenderness CV: RRR no murmurs rubs or gallops Lungs: CTAB no crackles,  wheeze, rhonchi Ext: no edema Skin: warm, dry, no rash  Assessment/Plan:  Sinsusitis and allergic rhinitis Bacterial based on: Symptoms >10 days, double sickening, or severe symptoms in first 3 days  This seemed to start out with his allergies.  He will continue his over-the-counter antihistamine such as Claritin or Allegra daily.  After 7 days of Augmentin he will start Flonase for allergic component as long as this does not cause worsening nosebleeds  Treatment: -considered steroid: we opted out-he is concerned about weight gain -Antibiotic indicated: yes  Finally, we reviewed reasons to return to care including if symptoms worsen or persist or new concerns arise (particularly fever or shortness of breath)  Meds ordered this encounter  Medications  . amoxicillin-clavulanate (AUGMENTIN) 875-125 MG tablet    Sig: Take 1 tablet by mouth 2 (two) times daily for 7 days.    Dispense:  14 tablet    Refill:  0  . fluticasone (FLONASE) 50 MCG/ACT nasal spray    Sig: Place 2 sprays into both nostrils daily.    Dispense:  16 g    Refill:  3    [name], MD

## 2018-02-07 ENCOUNTER — Encounter: Payer: Self-pay | Admitting: Family Medicine

## 2018-02-07 DIAGNOSIS — Z860101 Personal history of adenomatous and serrated colon polyps: Secondary | ICD-10-CM | POA: Insufficient documentation

## 2018-02-07 DIAGNOSIS — Z86010 Personal history of colonic polyps: Secondary | ICD-10-CM | POA: Insufficient documentation

## 2018-02-12 ENCOUNTER — Other Ambulatory Visit: Payer: Self-pay

## 2018-02-20 ENCOUNTER — Other Ambulatory Visit: Payer: Self-pay | Admitting: Internal Medicine

## 2018-02-20 ENCOUNTER — Other Ambulatory Visit: Payer: Self-pay | Admitting: Gastroenterology

## 2018-02-20 DIAGNOSIS — K862 Cyst of pancreas: Secondary | ICD-10-CM

## 2018-03-21 ENCOUNTER — Ambulatory Visit
Admission: RE | Admit: 2018-03-21 | Discharge: 2018-03-21 | Disposition: A | Discharge status: Home / Self Care | Payer: BLUE CROSS/BLUE SHIELD | Source: Ambulatory Visit | Attending: Gastroenterology | Admitting: Gastroenterology

## 2018-03-21 DIAGNOSIS — K859 Acute pancreatitis without necrosis or infection, unspecified: Secondary | ICD-10-CM | POA: Diagnosis not present

## 2018-03-21 DIAGNOSIS — K862 Cyst of pancreas: Secondary | ICD-10-CM

## 2018-03-21 MED ORDER — IOHEXOL 300 MG/ML  SOLN
100.0000 mL | Freq: Once | INTRAMUSCULAR | Status: AC | PRN
  Administered 2018-03-21: 100 mL via INTRAVENOUS

## 2018-07-05 ENCOUNTER — Other Ambulatory Visit: Payer: Self-pay | Admitting: Family Medicine

## 2018-08-16 ENCOUNTER — Ambulatory Visit: Payer: BLUE CROSS/BLUE SHIELD | Admitting: Physician Assistant

## 2018-09-04 ENCOUNTER — Other Ambulatory Visit: Payer: Self-pay | Admitting: Gastroenterology

## 2018-09-04 ENCOUNTER — Ambulatory Visit
Admission: RE | Admit: 2018-09-04 | Discharge: 2018-09-04 | Disposition: A | Discharge status: Home / Self Care | Payer: BLUE CROSS/BLUE SHIELD | Source: Ambulatory Visit | Attending: Gastroenterology | Admitting: Gastroenterology

## 2018-09-04 DIAGNOSIS — K863 Pseudocyst of pancreas: Secondary | ICD-10-CM | POA: Diagnosis not present

## 2018-09-04 DIAGNOSIS — Z8719 Personal history of other diseases of the digestive system: Secondary | ICD-10-CM | POA: Diagnosis not present

## 2018-09-04 MED ORDER — IOHEXOL 300 MG/ML  SOLN
100.0000 mL | Freq: Once | INTRAMUSCULAR | Status: AC | PRN
  Administered 2018-09-04: 100 mL via INTRAVENOUS

## 2018-09-09 ENCOUNTER — Other Ambulatory Visit: Payer: Self-pay | Admitting: Gastroenterology

## 2018-09-13 ENCOUNTER — Other Ambulatory Visit: Payer: Self-pay | Admitting: Gastroenterology

## 2018-09-19 ENCOUNTER — Ambulatory Visit (HOSPITAL_COMMUNITY)
Admission: RE | Admit: 2018-09-19 | Discharge: 2018-09-19 | Disposition: A | Discharge status: Home / Self Care | Payer: BLUE CROSS/BLUE SHIELD | Source: Ambulatory Visit | Attending: Gastroenterology | Admitting: Gastroenterology

## 2018-09-19 ENCOUNTER — Ambulatory Visit (HOSPITAL_COMMUNITY): Payer: BLUE CROSS/BLUE SHIELD | Admitting: Certified Registered Nurse Anesthetist

## 2018-09-19 ENCOUNTER — Encounter (HOSPITAL_COMMUNITY): Payer: Self-pay | Admitting: Certified Registered Nurse Anesthetist

## 2018-09-19 ENCOUNTER — Other Ambulatory Visit: Payer: Self-pay

## 2018-09-19 ENCOUNTER — Ambulatory Visit (HOSPITAL_COMMUNITY): Payer: BLUE CROSS/BLUE SHIELD

## 2018-09-19 ENCOUNTER — Encounter (HOSPITAL_COMMUNITY)
Admission: RE | Disposition: A | Discharge status: Home / Self Care | Payer: Self-pay | Source: Ambulatory Visit | Attending: Gastroenterology

## 2018-09-19 DIAGNOSIS — K8050 Calculus of bile duct without cholangitis or cholecystitis without obstruction: Secondary | ICD-10-CM | POA: Diagnosis not present

## 2018-09-19 DIAGNOSIS — G7000 Myasthenia gravis without (acute) exacerbation: Secondary | ICD-10-CM | POA: Diagnosis not present

## 2018-09-19 DIAGNOSIS — Z79890 Hormone replacement therapy (postmenopausal): Secondary | ICD-10-CM | POA: Insufficient documentation

## 2018-09-19 DIAGNOSIS — Z4659 Encounter for fitting and adjustment of other gastrointestinal appliance and device: Secondary | ICD-10-CM | POA: Insufficient documentation

## 2018-09-19 DIAGNOSIS — Z881 Allergy status to other antibiotic agents status: Secondary | ICD-10-CM | POA: Insufficient documentation

## 2018-09-19 DIAGNOSIS — E039 Hypothyroidism, unspecified: Secondary | ICD-10-CM | POA: Insufficient documentation

## 2018-09-19 DIAGNOSIS — M1990 Unspecified osteoarthritis, unspecified site: Secondary | ICD-10-CM | POA: Diagnosis not present

## 2018-09-19 DIAGNOSIS — Z79899 Other long term (current) drug therapy: Secondary | ICD-10-CM | POA: Diagnosis not present

## 2018-09-19 DIAGNOSIS — K831 Obstruction of bile duct: Secondary | ICD-10-CM | POA: Diagnosis not present

## 2018-09-19 DIAGNOSIS — Z888 Allergy status to other drugs, medicaments and biological substances status: Secondary | ICD-10-CM | POA: Diagnosis not present

## 2018-09-19 DIAGNOSIS — Z4689 Encounter for fitting and adjustment of other specified devices: Secondary | ICD-10-CM | POA: Diagnosis not present

## 2018-09-19 DIAGNOSIS — E785 Hyperlipidemia, unspecified: Secondary | ICD-10-CM | POA: Diagnosis not present

## 2018-09-19 DIAGNOSIS — Z7982 Long term (current) use of aspirin: Secondary | ICD-10-CM | POA: Insufficient documentation

## 2018-09-19 DIAGNOSIS — Z452 Encounter for adjustment and management of vascular access device: Secondary | ICD-10-CM | POA: Diagnosis not present

## 2018-09-19 HISTORY — PX: SPHINCTEROTOMY: SHX5544

## 2018-09-19 HISTORY — PX: REMOVAL OF STONES: SHX5545

## 2018-09-19 HISTORY — PX: STENT REMOVAL: SHX6421

## 2018-09-19 HISTORY — PX: ERCP: SHX5425

## 2018-09-19 SURGERY — ERCP, WITH INTERVENTION IF INDICATED
Anesthesia: General

## 2018-09-19 MED ORDER — ONDANSETRON HCL 4 MG/2ML IJ SOLN
INTRAMUSCULAR | Status: DC | PRN
  Administered 2018-09-19: 4 mg via INTRAVENOUS

## 2018-09-19 MED ORDER — SODIUM CHLORIDE 0.9 % IV SOLN: INTRAVENOUS | Status: DC

## 2018-09-19 MED ORDER — INDOMETHACIN 50 MG RE SUPP
RECTAL | Status: AC
  Filled 2018-09-19: qty 2

## 2018-09-19 MED ORDER — CIPROFLOXACIN IN D5W 400 MG/200ML IV SOLN
INTRAVENOUS | Status: AC
  Filled 2018-09-19: qty 200

## 2018-09-19 MED ORDER — INDOMETHACIN 50 MG RE SUPP
RECTAL | Status: DC | PRN
  Administered 2018-09-19: 100 mg via RECTAL

## 2018-09-19 MED ORDER — ROCURONIUM BROMIDE 10 MG/ML (PF) SYRINGE
PREFILLED_SYRINGE | INTRAVENOUS | Status: DC | PRN
  Administered 2018-09-19: 40 mg via INTRAVENOUS

## 2018-09-19 MED ORDER — MIDAZOLAM HCL 2 MG/2ML IJ SOLN
INTRAMUSCULAR | Status: AC
  Filled 2018-09-19: qty 2

## 2018-09-19 MED ORDER — FENTANYL CITRATE (PF) 100 MCG/2ML IJ SOLN
INTRAMUSCULAR | Status: DC | PRN
  Administered 2018-09-19 (×2): 50 ug via INTRAVENOUS

## 2018-09-19 MED ORDER — LACTATED RINGERS IV SOLN
INTRAVENOUS | Status: DC
  Administered 2018-09-19: 12:00:00 via INTRAVENOUS

## 2018-09-19 MED ORDER — DEXAMETHASONE SODIUM PHOSPHATE 4 MG/ML IJ SOLN
INTRAMUSCULAR | Status: DC | PRN
  Administered 2018-09-19: 10 mg via INTRAVENOUS

## 2018-09-19 MED ORDER — PROPOFOL 10 MG/ML IV BOLUS
INTRAVENOUS | Status: DC | PRN
  Administered 2018-09-19: 160 mg via INTRAVENOUS

## 2018-09-19 MED ORDER — SODIUM CHLORIDE 0.9 % IV SOLN
INTRAVENOUS | Status: DC | PRN
  Administered 2018-09-19: 15 mL

## 2018-09-19 MED ORDER — FENTANYL CITRATE (PF) 100 MCG/2ML IJ SOLN
INTRAMUSCULAR | Status: AC
  Filled 2018-09-19: qty 2

## 2018-09-19 MED ORDER — SUCCINYLCHOLINE CHLORIDE 200 MG/10ML IV SOSY
PREFILLED_SYRINGE | INTRAVENOUS | Status: DC | PRN
  Administered 2018-09-19: 120 mg via INTRAVENOUS

## 2018-09-19 MED ORDER — CIPROFLOXACIN IN D5W 400 MG/200ML IV SOLN
INTRAVENOUS | Status: DC | PRN
  Administered 2018-09-19: 400 mg via INTRAVENOUS

## 2018-09-19 MED ORDER — GLUCAGON HCL RDNA (DIAGNOSTIC) 1 MG IJ SOLR
INTRAMUSCULAR | Status: AC
  Filled 2018-09-19: qty 1

## 2018-09-19 MED ORDER — LIDOCAINE 2% (20 MG/ML) 5 ML SYRINGE
INTRAMUSCULAR | Status: DC | PRN
  Administered 2018-09-19: 60 mg via INTRAVENOUS

## 2018-09-19 MED ORDER — SUGAMMADEX SODIUM 200 MG/2ML IV SOLN
INTRAVENOUS | Status: DC | PRN
  Administered 2018-09-19: 200 mg via INTRAVENOUS

## 2018-09-19 NOTE — Anesthesia Procedure Notes (Signed)
Procedure Name: Intubation Date/Time: 09/19/2018 2:16 PM Performed by: Vanessa Durhamochran, Dakhari Zuver Glenn, CRNA Pre-anesthesia Checklist: Patient identified, Emergency Drugs available, Suction available and Patient being monitored Patient Re-evaluated:Patient Re-evaluated prior to induction Oxygen Delivery Method: Circle system utilized Preoxygenation: Pre-oxygenation with 100% oxygen Induction Type: IV induction Ventilation: Mask ventilation without difficulty Laryngoscope Size: 2 and Miller Grade View: Grade I Tube type: Oral Tube size: 7.5 mm Number of attempts: 1 Airway Equipment and Method: Stylet Placement Confirmation: ETT inserted through vocal cords under direct vision,  positive ETCO2 and breath sounds checked- equal and bilateral Secured at: 22 cm Tube secured with: Tape Dental Injury: Teeth and Oropharynx as per pre-operative assessment

## 2018-09-19 NOTE — Anesthesia Postprocedure Evaluation (Signed)
Anesthesia Post Note  Patient: Royetta AsalJoseph R Fredericksen  Procedure(s) Performed: ENDOSCOPIC RETROGRADE CHOLANGIOPANCREATOGRAPHY (ERCP) (N/A ) SPHINCTEROTOMY REMOVAL OF STONES     Patient location during evaluation: PACU Anesthesia Type: General Level of consciousness: awake and alert Pain management: pain level controlled Vital Signs Assessment: post-procedure vital signs reviewed and stable Respiratory status: spontaneous breathing, nonlabored ventilation and respiratory function stable Cardiovascular status: blood pressure returned to baseline and stable Postop Assessment: no apparent nausea or vomiting Anesthetic complications: no    Last Vitals:  Vitals:   09/19/18 1150 09/19/18 1506  BP: 135/85 138/83  Pulse: (!) 56 67  Resp: 20 17  Temp: (!) 36.4 C (!) 36.4 C  SpO2: 98% 100%    Last Pain:  Vitals:   09/19/18 1506  TempSrc: Oral  PainSc: 0-No pain                 Lowella CurbWarren Ray Miller

## 2018-09-19 NOTE — Op Note (Signed)
[facility] Patient Name: Christopher Blair Procedure Date: 09/19/2018 MRN: [id number] Attending MD: Vida Rigger , MD Date of Birth: [birth year] CSN: [id number] Age: 57 Admit Type: Outpatient Procedure:                ERCP Indications:              Suspected bile duct stone(s) on MRCP, Biliary stent                            removal Providers:                Vida Rigger, MD, Jacquiline Doe, RN, Harrington Challenger,                            Technician Referring MD:              Medicines:                General Anesthesia Complications:            No immediate complications. Estimated Blood Loss:     Estimated blood loss: none. Procedure:                Pre-Anesthesia Assessment:                           - Prior to the procedure, a History and Physical                            was performed, and patient medications and                            allergies were reviewed. The patient's tolerance of                            previous anesthesia was also reviewed. The risks                            and benefits of the procedure and the sedation                            options and risks were discussed with the patient.                            All questions were answered, and informed consent                            was obtained. Prior Anticoagulants: The patient has                            taken no previous anticoagulant or antiplatelet                            agents. ASA Grade Assessment: II - A patient with                            mild systemic disease. After  reviewing the risks                            and benefits, the patient was deemed in                            satisfactory condition to undergo the procedure.                           After obtaining informed consent, the scope was                            passed under direct vision. Throughout the                            procedure, the patient's blood pressure, pulse, and                             oxygen saturations were monitored continuously. The                            [id number] ([id number] Olympus ERCP was introduced                            through the mouth, and used to inject contrast into                            and used to cannulate the bile duct. The ERCP was                            accomplished without difficulty. The patient                            tolerated the procedure well. Scope In: Scope Out: Findings:      One stent was removed from the biliary tree using a snare. The major       papilla was normal. Deep selective cannulation was obtained on the first       attempt dye was injected and we proceeded with a biliary sphincterotomy       which was made with a Hydratome sphincterotome using ERBE       electrocautery. There was no post-sphincterotomy bleeding. To discover       objects, the biliary tree was swept with an adjustable 9- 12 mm balloon       starting at the bifurcation. Multiple balloon pull-through's were done       using both balloons and sludge and at least 2 stones were swept from the       duct on the first 2 pull-through's. All stones were removed. At least 3       subsequent balloon pull-through's were negative we then proceeded with       an occlusion cholangiogram in the customary fashion which was negative       and there was adequate biliary drainage and we elected to stop the       procedure at this point and there was no pancreatic duct injection or  wire advancement throughout the procedure Impression:               - The major papilla appeared normal.                           - Choledocholithiasis was found. Complete removal                            was accomplished by biliary sphincterotomy and                            balloon extraction.                           - One stent was removed from the biliary tree at                            the beginning of the procedure.                           - A biliary  sphincterotomy was performed.                           - The biliary tree was swept. Moderate Sedation:      Not Applicable - Patient had care per Anesthesia. Recommendation:           - Clear liquid diet for 4 hours. If doing well may                            have soft solids this evening                           - Continue present medications.                           - No aspirin, ibuprofen, naproxen, or other                            non-steroidal anti-inflammatory drugs for 5 days.                           - Return to GI clinic in 1-2 month.                           - Telephone GI clinic if symptomatic PRN. Procedure Code(s):        --- Professional ---                           [phone], Endoscopic retrograde                            cholangiopancreatography (ERCP); with removal of                            foreign body(s) or stent(s) from biliary/pancreatic  duct(s)                           43264, Endoscopic retrograde                            cholangiopancreatography (ERCP); with removal of                            calculi/debris from biliary/pancreatic duct(s)                           43262, Endoscopic retrograde                            cholangiopancreatography (ERCP); with                            sphincterotomy/papillotomy Diagnosis Code(s):        --- Professional ---                           K80.50, Calculus of bile duct without cholangitis                            or cholecystitis without obstruction                           Z46.59, Encounter for fitting and adjustment of                            other gastrointestinal appliance and device CPT copyright 2018 [employer]. All rights reserved. The codes documented in this report are preliminary and upon coder review may  be revised to meet current compliance requirements. Vida Rigger, MD 09/19/2018 3:03:05 PM This report has been signed  electronically. Number of Addenda: 0

## 2018-09-19 NOTE — Progress Notes (Signed)
Christopher Blair 12:30 PM  Subjective: Patient asymptomatic from a GI standpoint and his case discussed with my partner Christopher Blair his hospital computer chart and x-rays reviewed and he has no other complaints  Objective: No signs stable afebrile exam please see preassessment evaluation previous ERCP CT and MRI reviewed  Assessment: Questionable CBD stones in patient with CBD stent  Plan: Okay to proceed with stent removal and repeat ERCP and possible sphincterotomy and stone extraction with anesthesia assistance in the risks benefits success rate was discussed with the patient and his son  [facility],Christopher Blair  Pager [phone] After 5PM or if no answer call [phone]

## 2018-09-19 NOTE — Transfer of Care (Signed)
Immediate Anesthesia Transfer of Care Note  Patient: Royetta AsalJoseph R Emanuele  Procedure(s) Performed: ENDOSCOPIC RETROGRADE CHOLANGIOPANCREATOGRAPHY (ERCP) (N/A ) SPHINCTEROTOMY REMOVAL OF STONES  Patient Location: Endoscopy Unit  Anesthesia Type:General  Level of Consciousness: awake, alert , oriented and patient cooperative  Airway & Oxygen Therapy: Patient Spontanous Breathing and Patient connected to face mask  Post-op Assessment: Report given to RN and Post -op Vital signs reviewed and stable  Post vital signs: Reviewed and stable  Last Vitals:  Vitals Value Taken Time  BP    Temp    Pulse 67 09/19/2018  3:06 PM  Resp 13 09/19/2018  3:06 PM  SpO2 100 % 09/19/2018  3:06 PM  Vitals shown include unvalidated device data.  Last Pain:  Vitals:   09/19/18 1150  TempSrc: Oral         Complications: No apparent anesthesia complications

## 2018-09-19 NOTE — Discharge Instructions (Signed)
Clear liquids only until 7 PM and if doing well may have soft solids and call if significant nausea vomiting fever signs of GI bleeding or abdominal pain otherwise follow-up with your usual gastroenterologist Dr. Levora AngelBrahmbhatt in 1 to 2 months and okay to work tomorrow if doing well.     YOU HAD AN ENDOSCOPIC PROCEDURE TODAY: Refer to the procedure report and other information in the discharge instructions given to you for any specific questions about what was found during the examination. If this information does not answer your questions, please call [facility] office at [phone] to clarify.   YOU SHOULD EXPECT: Some feelings of bloating in the abdomen. Passage of more gas than usual. Walking can help get rid of the air that was put into your GI tract during the procedure and reduce the bloating. If you had a lower endoscopy (such as a colonoscopy or flexible sigmoidoscopy) you may notice spotting of blood in your stool or on the toilet paper. Some abdominal soreness may be present for a day or two, also.  DIET: Your first meal following the procedure should be a light meal and then it is ok to progress to your normal diet. A half-sandwich or bowl of soup is an example of a good first meal. Heavy or fried foods are harder to digest and may make you feel nauseous or bloated. Drink plenty of fluids but you should avoid alcoholic beverages for 24 hours. If you had a esophageal dilation, please see attached instructions for diet.   ACTIVITY: Your care partner should take you home directly after the procedure. You should plan to take it easy, moving slowly for the rest of the day. You can resume normal activity the day after the procedure however YOU SHOULD NOT DRIVE, use power tools, machinery or perform tasks that involve climbing or major physical exertion for 24 hours (because of the sedation medicines used during the test).   SYMPTOMS TO REPORT IMMEDIATELY: A gastroenterologist can be reached at any  hour. Please call [phone]  for any of the following symptoms:    Following upper endoscopy (EGD, EUS, ERCP, esophageal dilation) Vomiting of blood or coffee ground material  New, significant abdominal pain  New, significant chest pain or pain under the shoulder blades  Painful or persistently difficult swallowing  New shortness of breath  Black, tarry-looking or red, bloody stools  FOLLOW UP:  If any biopsies were taken you will be contacted by phone or by letter within the next 1-3 weeks. Call [phone]  if you have not heard about the biopsies in 3 weeks.  Please also call with any specific questions about appointments or follow up tests.

## 2018-09-19 NOTE — Anesthesia Preprocedure Evaluation (Signed)
Anesthesia Evaluation  Patient identified by MRN, date of birth, ID band Patient awake    Reviewed: Allergy & Precautions, NPO status , Patient's Chart, lab work & pertinent test results  Airway Mallampati: I  TM Distance: >3 FB Neck ROM: Full    Dental  (+) Teeth Intact, Dental Advisory Given   Pulmonary neg pulmonary ROS,    breath sounds clear to auscultation       Cardiovascular negative cardio ROS   Rhythm:Regular Rate:Normal     Neuro/Psych  Neuromuscular disease    GI/Hepatic negative GI ROS, Neg liver ROS,   Endo/Other  Hypothyroidism   Renal/GU negative Renal ROS     Musculoskeletal negative musculoskeletal ROS (+)   Abdominal   Peds  Hematology negative hematology ROS (+)   Anesthesia Other Findings   Reproductive/Obstetrics                             Lab Results  Component Value Date   WBC 7.2 12/11/2017   HGB 14.3 12/11/2017   HCT 42 12/11/2017   MCV 91.9 12/04/2017   PLT 511 (A) 12/11/2017   Lab Results  Component Value Date   CREATININE 1.0 12/11/2017   BUN 15 12/11/2017   NA 138 12/11/2017   K 5.0 12/11/2017   CL 100 12/04/2017   CO2 28 12/04/2017   Lab Results  Component Value Date   INR 1.25 11/22/2017   INR 1.25 11/22/2017   EKG: sinus tachycardia.  Anesthesia Physical  Anesthesia Plan  ASA: II  Anesthesia Plan: General   Post-op Pain Management:    Induction: Intravenous  PONV Risk Score and Plan: 3 and Ondansetron, Dexamethasone and Midazolam  Airway Management Planned: Oral ETT  Additional Equipment: None  Intra-op Plan:   Post-operative Plan: Extubation in OR  Informed Consent: I have reviewed the patients History and Physical, chart, labs and discussed the procedure including the risks, benefits and alternatives for the proposed anesthesia with the patient or authorized representative who has indicated his/her understanding and  acceptance.   Dental advisory given  Plan Discussed with: CRNA  Anesthesia Plan Comments:         Anesthesia Quick Evaluation

## 2018-09-20 ENCOUNTER — Encounter (HOSPITAL_COMMUNITY): Payer: Self-pay | Admitting: Gastroenterology

## 2018-12-22 ENCOUNTER — Other Ambulatory Visit: Payer: Self-pay | Admitting: Family Medicine

## 2018-12-30 ENCOUNTER — Other Ambulatory Visit: Payer: Self-pay

## 2018-12-30 ENCOUNTER — Telehealth: Payer: Self-pay | Admitting: Family Medicine

## 2018-12-30 MED ORDER — LEVOTHYROXINE SODIUM 100 MCG PO TABS: 100.0000 ug | ORAL_TABLET | Freq: Every day | ORAL | 0 refills | Status: DC

## 2018-12-30 NOTE — Telephone Encounter (Signed)
Rx sent to pharmacy   

## 2018-12-30 NOTE — Telephone Encounter (Signed)
Patient is requesting a refill of his thyroid medication.  He is aware he needs an appointment.  Please advise.

## 2018-12-31 ENCOUNTER — Ambulatory Visit (INDEPENDENT_AMBULATORY_CARE_PROVIDER_SITE_OTHER): Payer: BLUE CROSS/BLUE SHIELD | Admitting: Family Medicine

## 2018-12-31 ENCOUNTER — Encounter: Payer: Self-pay | Admitting: Family Medicine

## 2018-12-31 VITALS — Temp 96.3°F | Ht 68.0 in | Wt 200.0 lb

## 2018-12-31 DIAGNOSIS — G7000 Myasthenia gravis without (acute) exacerbation: Secondary | ICD-10-CM | POA: Diagnosis not present

## 2018-12-31 DIAGNOSIS — E039 Hypothyroidism, unspecified: Secondary | ICD-10-CM | POA: Diagnosis not present

## 2018-12-31 DIAGNOSIS — Z125 Encounter for screening for malignant neoplasm of prostate: Secondary | ICD-10-CM | POA: Diagnosis not present

## 2018-12-31 DIAGNOSIS — E785 Hyperlipidemia, unspecified: Secondary | ICD-10-CM | POA: Diagnosis not present

## 2018-12-31 DIAGNOSIS — Z6830 Body mass index (BMI) 30.0-30.9, adult: Secondary | ICD-10-CM

## 2018-12-31 MED ORDER — LEVOTHYROXINE SODIUM 100 MCG PO TABS: 100.0000 ug | ORAL_TABLET | Freq: Every day | ORAL | 3 refills | Status: DC

## 2018-12-31 NOTE — Progress Notes (Signed)
Phone [phone]   Subjective:  Virtual visit via Video note  Our team/I connected with Christopher Blair on 12/31/18 at  9:00 AM EDT by a video enabled telemedicine application (webex) and verified that I am speaking with the correct person using two identifiers.  Location patient: Home (02) Location provider: [facility], office Persons participating in the virtual visit: patient  Our team/I discussed the limitations of evaluation and management by telemedicine and the availability of in person appointments. In light of current covid-19 pandemic, patient also understands that we are trying to protect them by minimizing in office contact if at all possible.  The patient expressed consent for telemedicine visit and agreed to proceed.   ROS- No chest pain or shortness of breath. No headache or blurry vision. No fever/chills   Past Medical History-  Patient Active Problem List   Diagnosis Date Noted  . Hyperlipidemia 07/29/2015    Priority: Medium  . Family history of MI (myocardial infarction) 10/19/2014    Priority: Medium  . Hypothyroidism 01/05/2014    Priority: Medium  . Myasthenia gravis (HCC) 02/08/2010    Priority: Low  . History of adenomatous polyp of colon 02/07/2018  . Gallstone pancreatitis 11/22/2017    Medications- reviewed and updated Current Outpatient Medications  Medication Sig Dispense Refill  . ibuprofen (ADVIL,MOTRIN) 200 MG tablet Take 400 mg by mouth every 6 (six) hours as needed for headache or moderate pain.    Marland Kitchen levothyroxine (SYNTHROID, LEVOTHROID) 100 MCG tablet Take 1 tablet (100 mcg total) by mouth daily. 90 tablet 0    Objective:  Temp (!) 96.3 F (35.7 C) Comment: home temp  Ht 5\' 8"  (1.727 m)   Wt 200 lb (90.7 kg) Comment: home scale  BMI 30.41 kg/m  Gen: NAD, resting comfortably Lungs: nonlabored, normal respiratory rate  Skin: warm, dry, no obvious rash    Assessment and Plan   #hypothyroidism S: On thyroid medication-has been  well controlled on levothyroxine 100 mcg Lab Results  Component Value Date   TSH 2.073 11/23/2017   A/P: Suspect Stable. Continue current medications.  He will come back for labs   #hyperlipidemia S: Poorly controlled on no medication.  Father with heart attack at 85 and was not a smoker.  We have previously plan to start statin if ten-year risk is above 12%.  Reports being far more active over last year- hoping for improved #s. Weight has slipped some up to 200- but had gotten up to 220 so this is an improvement.  Lab Results  Component Value Date   CHOL 262 (H) 10/16/2016   HDL 38.40 (L) 10/16/2016   LDLCALC 186 (H) 10/16/2016   LDLDIRECT 105.0 10/27/2015   TRIG 187.0 (H) 10/16/2016   CHOLHDL 7 10/16/2016   A/P: Suspect we will need to start statin- we will get updated lipid panel   #Myasthenia gravis S: Patient with history of thymectomy in 1994.  He feels very fatigued if he gets sick since that time.  But no obvious recurrence A/P: Appears stable without  recurrence- continue to monitor  # obesity- Encouraged need for healthy eating, regular exercise, weight loss.   Likely 1 year follow up  Lab/Order associations: Hypothyroidism, unspecified type - Plan: TSH  Hyperlipidemia, unspecified hyperlipidemia type - Plan: CBC, Comprehensive metabolic panel, Lipid panel  Myasthenia gravis (HCC)  Screening for prostate cancer - Plan: PSA  BMI 30.0-30.9,adult  Meds ordered this encounter  Medications  . levothyroxine (SYNTHROID, LEVOTHROID) 100 MCG tablet  Sig: Take 1 tablet (100 mcg total) by mouth daily.    Dispense:  90 tablet    Refill:  3    Return precautions advised.  [name], MD .Karene Fry

## 2018-12-31 NOTE — Patient Instructions (Addendum)
Please call to schedule a lab visit for later this week

## 2019-01-01 ENCOUNTER — Other Ambulatory Visit (INDEPENDENT_AMBULATORY_CARE_PROVIDER_SITE_OTHER): Payer: BLUE CROSS/BLUE SHIELD

## 2019-01-01 DIAGNOSIS — E039 Hypothyroidism, unspecified: Secondary | ICD-10-CM

## 2019-01-01 DIAGNOSIS — Z125 Encounter for screening for malignant neoplasm of prostate: Secondary | ICD-10-CM

## 2019-01-01 DIAGNOSIS — E785 Hyperlipidemia, unspecified: Secondary | ICD-10-CM

## 2019-01-01 LAB — COMPREHENSIVE METABOLIC PANEL
ALT: 19 U/L (ref 0–53)
AST: 17 U/L (ref 0–37)
Albumin: 4.3 g/dL (ref 3.5–5.2)
Alkaline Phosphatase: 74 U/L (ref 39–117)
BILIRUBIN TOTAL: 1.3 mg/dL — AB (ref 0.2–1.2)
BUN: 13 mg/dL (ref 6–23)
CALCIUM: 8.8 mg/dL (ref 8.4–10.5)
CO2: 27 mEq/L (ref 19–32)
Chloride: 103 mEq/L (ref 96–112)
Creatinine, Ser: 0.92 mg/dL (ref 0.40–1.50)
GFR: 84.44 mL/min (ref >60.00)
Glucose, Bld: 100 mg/dL — ABNORMAL HIGH (ref 70–99)
Potassium: 4 mEq/L (ref 3.5–5.1)
Sodium: 137 mEq/L (ref 135–145)
Total Protein: 6.7 g/dL (ref 6.0–8.3)

## 2019-01-01 LAB — CBC
HCT: 46 % (ref 39.0–52.0)
Hemoglobin: 16.1 g/dL (ref 13.0–17.0)
MCHC: 34.9 g/dL (ref 30.0–36.0)
MCV: 90.8 fl (ref 78.0–100.0)
Platelets: 238 10*3/uL (ref 150.0–400.0)
RBC: 5.06 Mil/uL (ref 4.22–5.81)
RDW: 13 % (ref 11.5–15.5)
WBC: 6.4 10*3/uL (ref 4.0–10.5)

## 2019-01-01 LAB — LIPID PANEL
Cholesterol: 234 mg/dL — ABNORMAL HIGH (ref 0–200)
HDL: 36.2 mg/dL — AB (ref >39.00)
LDL Cholesterol: 159 mg/dL — ABNORMAL HIGH (ref 0–99)
NonHDL: 197.47
Total CHOL/HDL Ratio: 6
Triglycerides: 191 mg/dL — ABNORMAL HIGH (ref 0.0–149.0)
VLDL: 38.2 mg/dL (ref 0.0–40.0)

## 2019-01-01 LAB — TSH: TSH: 5.65 u[IU]/mL — ABNORMAL HIGH (ref 0.35–4.50)

## 2019-01-01 LAB — PSA: PSA: 0.76 ng/mL (ref 0.10–4.00)

## 2019-01-27 ENCOUNTER — Ambulatory Visit (INDEPENDENT_AMBULATORY_CARE_PROVIDER_SITE_OTHER): Payer: BLUE CROSS/BLUE SHIELD | Admitting: Family Medicine

## 2019-01-27 ENCOUNTER — Encounter: Payer: Self-pay | Admitting: Family Medicine

## 2019-01-27 VITALS — Temp 96.8°F | Wt 200.0 lb

## 2019-01-27 DIAGNOSIS — H9202 Otalgia, left ear: Secondary | ICD-10-CM

## 2019-01-27 NOTE — Progress Notes (Signed)
Phone [phone]   Subjective:  Virtual visit via Video note. Chief complaint: Chief Complaint  Patient presents with  . Otalgia    This visit type was conducted due to national recommendations for restrictions regarding the COVID-19 Pandemic (e.g. social distancing).  This format is felt to be most appropriate for this patient at this time balancing risks to patient and risks to population by having him in for in person visit.  No physical exam was performed (except for noted visual exam or audio findings with Telehealth visits).    Our team/I connected with Christopher Blair on 01/27/19 at  1:20 PM EDT by a video enabled telemedicine application ([email or web address]) and verified that I am speaking with the correct person using two identifiers.  Location patient: Home-O[place] [facility], office Persons participating in the virtual visit:  patient  Our team/I discussed the limitations of evaluation and management by telemedicine and the availability of in person appointments. In light of current covid-19 pandemic, patient also understands that we are trying to protect them by minimizing in office contact if at all possible.  The patient expressed consent for telemedicine visit and agreed to proceed. Patient understands insurance will be billed.   ROS- no fever/chills/cough. No chest pain or shortness of breath. No headache or blurry vision.    Past Medical History-  Patient Active Problem List   Diagnosis Date Noted  . Hyperlipidemia 07/29/2015    Priority: Medium  . Family history of MI (myocardial infarction) 10/19/2014    Priority: Medium  . Hypothyroidism 01/05/2014    Priority: Medium  . Myasthenia gravis (HCC) 02/08/2010    Priority: Low  . History of adenomatous polyp of colon 02/07/2018  . Gallstone pancreatitis 11/22/2017    Medications- reviewed and updated Current Outpatient Medications  Medication Sig Dispense Refill  . ibuprofen (ADVIL,MOTRIN) 200 MG tablet  Take 400 mg by mouth every 6 (six) hours as needed for headache or moderate pain.    Marland Kitchen. levothyroxine (SYNTHROID, LEVOTHROID) 100 MCG tablet Take 1 tablet (100 mcg total) by mouth daily. 90 tablet 3   No current facility-administered medications for this visit.      Objective:  Temp (!) 96.8 F (36 C)   Wt 200 lb (90.7 kg)   BMI 30.41 kg/m  Gen: NAD, resting comfortably External ear in morning- able to move ear without worsening ear pain.  Patient has some pain radiating into the left neck as well as in front of the left ear-opens and closes jaw without difficulty or increasing pain. Lungs: nonlabored, normal respiratory rate  Skin: appears dry, no obvious rash    Assessment and Plan   # Left ear pain S:patient woke up with left sided ear ache last night- woke up again this morning and had same issue- feels pain from ear down into neck and also right in front of the left ear. Has not noted swelling. No redness. Not worse with moving the jaw or with chewing specifically. No clicking or popping felt in that area. Pain level is mild aching  Twinges on and off for a few days. Ear does not feel full.   A/P: Patient is concerned about a potential ear infection- we discussed the potential benefits of treating as such with Augmentin versus risks versus risks of coming into office for evaluation in light of COVID-19- ultimately we opted to give patient 2 or 3 days to see if his symptoms improve with conservative measures like Tylenol, ibuprofen, warm compresses-if symptoms fail  to improve by the end of the week or symptoms worsen-I agreed to send in Augmentin for him-she will reach out by my chart-he does not need another visit for this.  -If he fails to improve on Augmentin-would need to see in person as long as afebrile and without cough  Lab/Order associations: Left ear pain  Return precautions advised.  [name], MD Over last month

## 2019-01-27 NOTE — Patient Instructions (Signed)
Video visit

## 2019-06-05 ENCOUNTER — Ambulatory Visit: Payer: BLUE CROSS/BLUE SHIELD | Admitting: Family Medicine

## 2019-06-10 ENCOUNTER — Telehealth: Payer: Self-pay

## 2019-06-10 NOTE — Telephone Encounter (Signed)
Called pt to advised that he will need an ov to fill out PPW. No answer will try again tomorrow

## 2019-06-11 NOTE — Telephone Encounter (Signed)
Called pt again no answer. Will send a Mychart message.

## 2019-06-12 NOTE — Telephone Encounter (Signed)
Called pt again with no answer. Will created a CRM and close note.

## 2019-12-06 ENCOUNTER — Ambulatory Visit: Payer: Self-pay | Attending: Internal Medicine

## 2019-12-06 DIAGNOSIS — Z23 Encounter for immunization: Secondary | ICD-10-CM | POA: Insufficient documentation

## 2019-12-06 NOTE — Progress Notes (Signed)
   Covid-19 Vaccination Clinic  Name:  Christopher Blair    MRN: [id number] DOB: 10/31/1960  12/06/2019  [name] was observed post Covid-19 immunization for 15 minutes without incidence. He was provided with Vaccine Information Sheet and instruction to access the V-Safe system.   [name] was instructed to call 911 with any severe reactions post vaccine: Marland Kitchen Difficulty breathing  . Swelling of your face and throat  . A fast heartbeat  . A bad rash all over your body  . Dizziness and weakness    Immunizations Administered    Name Date Dose VIS Date Route   Pfizer COVID-19 Vaccine 12/06/2019  6:10 PM 0.3 mL 09/19/2019 Intramuscular   Manufacturer: [employer], [employer]   Lot: [id number]   NDC: [id number]

## 2019-12-27 ENCOUNTER — Ambulatory Visit: Payer: Self-pay | Attending: Internal Medicine

## 2019-12-27 DIAGNOSIS — Z23 Encounter for immunization: Secondary | ICD-10-CM

## 2019-12-27 NOTE — Progress Notes (Signed)
   Covid-19 Vaccination Clinic  Name:  NYLEN CREQUE    MRN: [id number] DOB: [birth year]  12/27/2019  Mr. Allcock was observed post Covid-19 immunization for 15 minutes without incident. He was provided with Vaccine Information Sheet and instruction to access the V-Safe system.   Mr. Mainwaring was instructed to call 911 with any severe reactions post vaccine: Marland Kitchen Difficulty breathing  . Swelling of face and throat  . A fast heartbeat  . A bad rash all over body  . Dizziness and weakness   Immunizations Administered    Name Date Dose VIS Date Route   Pfizer COVID-19 Vaccine 12/27/2019  9:27 AM 0.3 mL 09/19/2019 Intramuscular   Manufacturer: [employer], [employer]   Lot: [id number]   NDC: [address]-5

## 2019-12-31 ENCOUNTER — Encounter: Payer: Self-pay | Admitting: Family Medicine

## 2020-01-26 ENCOUNTER — Other Ambulatory Visit: Payer: Self-pay | Admitting: Family Medicine

## 2020-01-27 ENCOUNTER — Other Ambulatory Visit: Payer: Self-pay | Admitting: Family Medicine

## 2020-04-14 ENCOUNTER — Telehealth: Payer: Self-pay | Admitting: Family Medicine

## 2020-04-14 NOTE — Telephone Encounter (Signed)
Pt has not been seen since 12/2018. Please call and schedule an appointment for pt.

## 2020-04-14 NOTE — Telephone Encounter (Signed)
Patient called and states he does not have any refills left on his levothyroxine (SYNTHROID) 100 MCG tablet, and would like to know if Dr.hunter will renew the medication or if he needs to make an appointment to be seen before hand.

## 2020-04-15 MED ORDER — LEVOTHYROXINE SODIUM 100 MCG PO TABS: 100.0000 ug | ORAL_TABLET | Freq: Every day | ORAL | 0 refills | Status: DC

## 2020-04-15 NOTE — Addendum Note (Signed)
Addended by: Lieutenant Diego A on: 04/15/2020 10:53 AM   Modules accepted: Orders

## 2020-04-15 NOTE — Telephone Encounter (Signed)
Medication sent in to get him to his appointment.

## 2020-04-15 NOTE — Telephone Encounter (Signed)
  LAST APPOINTMENT DATE: 12/2018   NEXT APPOINTMENT DATE:@10 /15/2021  MEDICATION: levothyroxine (SYNTHROID) 100 MCG tablet  PHARMACY:[facility] [address] [place], [place] - [address]  COMMENTS: Next available is not until October, wanted to know if he can get enough to last.

## 2020-05-07 ENCOUNTER — Telehealth: Payer: Self-pay

## 2020-05-07 NOTE — Telephone Encounter (Signed)
..   LAST APPOINTMENT DATE: 04/14/2020   NEXT APPOINTMENT DATE:@10 /15/2021  MEDICATION:levothyroxine (SYNTHROID) 100 MCG tablet    PHARMACY:[facility] [address] [place], [place] - [address]  **Let patient know to contact pharmacy at the end of the day to make sure medication is ready. **  ** Please notify patient to allow 48-72 hours to process**  **Encourage patient to contact the pharmacy for refills or they can request refills through [facility]**  CLINICAL FILLS OUT ALL BELOW:   LAST REFILL:  QTY:  REFILL DATE:    OTHER COMMENTS:  Patient took his last pill today and is in need of more before his appt in October.   Okay for refill?  Please advise

## 2020-05-07 NOTE — Telephone Encounter (Signed)
Refill called in beginning of this month for 90 days will need to be seen for more refills.

## 2020-05-11 NOTE — Telephone Encounter (Signed)
Patient called back in frustrated because he has not heard anything. Did tell patient that it was last refilled on 7/8 and to call the pharmacy to see if they have the medication.

## 2020-07-22 NOTE — Patient Instructions (Addendum)
Health Maintenance Due  Topic Date Due  . TETANUS/TDAP -check with human resources and let me know if you have had Tdap since 2010 06/02/2019  . INFLUENZA VACCINE In office flu shot today 05/09/2020   Please stop by lab before you go If you have mychart- we will send your results within 3 business days of [place] receiving them.  If you do not have mychart- we will call you about results within 5 business days of [place] receiving them.  *please note we are currently using [facility] which has a longer processing time than [facility] typically so labs may not come back as quickly as in the past *please also note that you will see labs on mychart as soon as they post. I will later go in and write notes on them- will say "notes from Dr. Durene Cal"  Right elbow tennis elbow. Use counter force brace as much as possible  Please do exercises 3x a week for a month then once a week. Stop any exercise and move to next if increases pain more than 1-2/10.   If no improvement within a month or if worsenslet me refer you to sports medicine  Left knee pain in prior catcher- medial- this could be arthritis or degenerative meniscus tears. Try meloxicam for 10 days and may help arm as well. If no improvement can see sports medicine- honestly would see sports medicine- id ont like that its waking him up at night. After 10 days. If has flare ups in distant future- I think voltaren gel 4x a day for 7 days would be reasonable.   Can take pepcid if any reflux symptoms on mobic- if still having issues even with that please stop medicine. If kidney function does not look good on check I may ask you to stop meloxicam

## 2020-07-22 NOTE — Progress Notes (Signed)
Phone [phone] In person visit   Subjective:   Christopher Blair is a 59 y.o. year old very pleasant male patient who presents for/with See problem oriented charting Chief Complaint  Patient presents with  . Hyperlipidemia  . Hypothyroidism   This visit occurred during the SARS-CoV-2 public health emergency.  Safety protocols were in place, including screening questions prior to the visit, additional usage of staff PPE, and extensive cleaning of exam room while observing appropriate contact time as indicated for disinfecting solutions.   Past Medical History-  Patient Active Problem List   Diagnosis Date Noted  . Hyperlipidemia 07/29/2015    Priority: Medium  . Family history of MI (myocardial infarction) 10/19/2014    Priority: Medium  . Hypothyroidism 01/05/2014    Priority: Medium  . History of adenomatous polyp of colon 02/07/2018    Priority: Low  . Gallstone pancreatitis 11/22/2017    Priority: Low  . History of myasthenia gravis 02/08/2010    Priority: Low    Medications- reviewed and updated Current Outpatient Medications  Medication Sig Dispense Refill  . ibuprofen (ADVIL,MOTRIN) 200 MG tablet Take 400 mg by mouth every 6 (six) hours as needed for headache or moderate pain.    Marland Kitchen levothyroxine (SYNTHROID) 100 MCG tablet Take 1 tablet (100 mcg total) by mouth daily. 90 tablet 0  . meloxicam (MOBIC) 15 MG tablet Take 1 tablet (15 mg total) by mouth daily. 10 tablet 0   No current facility-administered medications for this visit.     Objective:  BP 122/80   Pulse 66   Temp 98 F (36.7 C) (Temporal)   Resp 18   Ht 5\' 8"  (1.727 m)   Wt 214 lb 6.4 oz (97.3 kg)   SpO2 96%   BMI 32.60 kg/m  Gen: NAD, resting comfortably CV: RRR no murmurs rubs or gallops Lungs: CTAB no crackles, wheeze, rhonchi Ext: no edema Skin: warm, dry MSK: Pain at lateral epicondyle of right arm with resisted pronation  Right knee normal. Left Knee: Normal to inspection with no  erythema or effusion or obvious bony abnormalities. Palpation normal with no warmth . Does have medial joint line tenderness. no patellar tenderness or condyle tenderness. ROM normal in flexion and extension and lower leg rotation. Ligaments with solid consistent endpoints including ACL, PCL, LCL, MCL. Negative Mcmurray's  For pain but does get a clicking sound each motion    Assessment and Plan  # Left Knee Pain S:Patient mentioned that he has been having stabbing knee pain that comes and goes on inside of knee- can even wake him up at night- does not stop him from doing anything. He stated that sometimes it comes and goes and other times it last all day. He has taken otc motrin with some relief. 2 months of pain. Does get some clicking and popping- worse with being down on knees and getting up.   Was a catcher for 20 years A/P:  Left knee pain in prior catcher- medial- this could be arthritis or degenerative meniscus tears. Try meloxicam for 10 days and may help arm as well. If no improvement can see sports medicine- honestly would see sports medicine- id ont like that its waking him up at night. After 10 days. If has flare ups in distant future- I think voltaren gel 4x a day for 7 days would be reasonable.   # Right Arm Pain S:Over the last couple of months he has been having pain near his elbow that comes and  goes. He stated that at times he has problems picking objects up. Motrin helps some on the arm. Tried an ankle brace on the arm A/P:  Right elbow tennis elbow. Use counter force brace as much as possible. Meloxicam 7-10 days  Please do exercises 3x a week for a month then once a week. Stop any exercise and move to next if increases pain more than 1-2/10.   If no improvement within a month or if worsenslet me refer you to sports medicine   #hyperlipidemia S: Medication:None.  Does have family history of heart attack in father at age 8.   With recent stressors weight up some- 14 lbs  since last year. Had been down prior with gallstone pancreatitis. 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 Diet has been looser just trying to grab and go. Lab Results  Component Value Date   CHOL 234 (H) 01/01/2019   HDL 36.20 (L) 01/01/2019   LDLCALC 159 (H) 01/01/2019   LDLDIRECT 105.0 10/27/2015   TRIG 191.0 (H) 01/01/2019   CHOLHDL 6 01/01/2019   A/P: 10-year ASCVD risk of 10.8%-we discussed he is in range where we could consider medication.  Discussed statin versus lifestyle changes versus coronary calcium scoring-patient opts to focus on lifestyle. Goal weight 195.    #hypothyroidism S: compliant On thyroid medication-Levothyroxine up from 88 mcg-last visit had missed a few doses of medication so we did not change dose Lab Results  Component Value Date   TSH 5.65 (H) 01/01/2019   A/P:Overdue for repeat TSH-ordered this today-continue current medications for now  Recommended follow up: Return in about 6 months (around 01/21/2021) for physical or sooner if needed.  Lab/Order associations:   ICD-10-CM   1. Hypothyroidism, unspecified type  E03.9 TSH  2. Hyperlipidemia, unspecified hyperlipidemia type  E78.5 CBC With Differential/Platelet    COMPLETE METABOLIC PANEL WITH GFR    Lipid Panel w/reflex Direct LDL  3. Right tennis elbow  M77.11   4. Chronic pain of left knee  M25.562    G89.29    Meds ordered this encounter  Medications  . meloxicam (MOBIC) 15 MG tablet    Sig: Take 1 tablet (15 mg total) by mouth daily.    Dispense:  10 tablet    Refill:  0    Return precautions advised.  [name], MD

## 2020-07-23 ENCOUNTER — Other Ambulatory Visit: Payer: Self-pay

## 2020-07-23 ENCOUNTER — Encounter: Payer: Self-pay | Admitting: Family Medicine

## 2020-07-23 ENCOUNTER — Ambulatory Visit: Payer: BC Managed Care – PPO | Admitting: Family Medicine

## 2020-07-23 VITALS — BP 122/80 | HR 66 | Temp 98.0°F | Resp 18 | Ht 68.0 in | Wt 214.4 lb

## 2020-07-23 DIAGNOSIS — E785 Hyperlipidemia, unspecified: Secondary | ICD-10-CM

## 2020-07-23 DIAGNOSIS — M7711 Lateral epicondylitis, right elbow: Secondary | ICD-10-CM | POA: Diagnosis not present

## 2020-07-23 DIAGNOSIS — Z23 Encounter for immunization: Secondary | ICD-10-CM

## 2020-07-23 DIAGNOSIS — M25562 Pain in left knee: Secondary | ICD-10-CM | POA: Diagnosis not present

## 2020-07-23 DIAGNOSIS — E039 Hypothyroidism, unspecified: Secondary | ICD-10-CM | POA: Diagnosis not present

## 2020-07-23 DIAGNOSIS — G8929 Other chronic pain: Secondary | ICD-10-CM

## 2020-07-23 LAB — CBC WITH DIFFERENTIAL/PLATELET: Basophils Relative: 1.3 %

## 2020-07-23 MED ORDER — MELOXICAM 15 MG PO TABS: 15.0000 mg | ORAL_TABLET | Freq: Every day | ORAL | 0 refills | Status: DC

## 2020-07-24 LAB — CBC WITH DIFFERENTIAL/PLATELET
Absolute Monocytes: 601 cells/uL (ref 200–950)
Basophils Absolute: 81 cells/uL (ref 0–200)
Eosinophils Absolute: 341 cells/uL (ref 15–500)
Eosinophils Relative: 5.5 %
HCT: 47.1 % (ref 38.5–50.0)
Hemoglobin: 16.1 g/dL (ref 13.2–17.1)
Lymphs Abs: 1395 cells/uL (ref 850–3900)
MCH: 30.9 pg (ref 27.0–33.0)
MCHC: 34.2 g/dL (ref 32.0–36.0)
MCV: 90.4 fL (ref 80.0–100.0)
MPV: 10.2 fL (ref 7.5–12.5)
Monocytes Relative: 9.7 %
Neutro Abs: 3782 cells/uL (ref 1500–7800)
Neutrophils Relative %: 61 %
Platelets: 272 10*3/uL (ref 140–400)
RBC: 5.21 10*6/uL (ref 4.20–5.80)
RDW: 12.3 % (ref 11.0–15.0)
Total Lymphocyte: 22.5 %
WBC: 6.2 10*3/uL (ref 3.8–10.8)

## 2020-07-24 LAB — COMPLETE METABOLIC PANEL WITH GFR
AG Ratio: 1.7 (calc) (ref 1.0–2.5)
ALT: 19 U/L (ref 9–46)
AST: 18 U/L (ref 10–35)
Albumin: 4.3 g/dL (ref 3.6–5.1)
Alkaline phosphatase (APISO): 75 U/L (ref 35–144)
BUN: 14 mg/dL (ref 7–25)
CO2: 25 mmol/L (ref 20–32)
Calcium: 9.3 mg/dL (ref 8.6–10.3)
Chloride: 104 mmol/L (ref 98–110)
Creat: 1.01 mg/dL (ref 0.70–1.33)
GFR, Est African American: 94 mL/min/{1.73_m2} (ref >=60)
GFR, Est Non African American: 81 mL/min/{1.73_m2} (ref >=60)
Globulin: 2.6 g/dL (calc) (ref 1.9–3.7)
Glucose, Bld: 105 mg/dL — ABNORMAL HIGH (ref 65–99)
Potassium: 4.6 mmol/L (ref 3.5–5.3)
Sodium: 138 mmol/L (ref 135–146)
Total Bilirubin: 0.9 mg/dL (ref 0.2–1.2)
Total Protein: 6.9 g/dL (ref 6.1–8.1)

## 2020-07-24 LAB — LIPID PANEL W/REFLEX DIRECT LDL
Cholesterol: 222 mg/dL — ABNORMAL HIGH (ref <200)
HDL: 37 mg/dL — ABNORMAL LOW (ref >=40)
LDL Cholesterol (Calc): 139 mg/dL (calc) — ABNORMAL HIGH
Non-HDL Cholesterol (Calc): 185 mg/dL (calc) — ABNORMAL HIGH (ref <130)
Total CHOL/HDL Ratio: 6 (calc) — ABNORMAL HIGH (ref <5.0)
Triglycerides: 324 mg/dL — ABNORMAL HIGH (ref <150)

## 2020-07-24 LAB — TSH: TSH: 3.32 mIU/L (ref 0.40–4.50)

## 2020-08-04 ENCOUNTER — Telehealth: Payer: Self-pay

## 2020-08-04 NOTE — Telephone Encounter (Signed)
Please call patient and advise sports med referral. Im ok with giving one more round of meloxicam but only if hes had significant improvement- its also not a great long term solution so even if he did improve but symptoms recurred off medicine- worth seeing sports medicine

## 2020-08-04 NOTE — Telephone Encounter (Signed)
Ok to refill? Or should I place a referral to sports medicine per your last office note. Please advise

## 2020-08-04 NOTE — Telephone Encounter (Signed)
.   LAST APPOINTMENT DATE: 07/23/2020   NEXT APPOINTMENT DATE:@Visit  date not found  MEDICATION:meloxicam (MOBIC) 15 MG tablet  PHARMACY:[facility] [address] [place], [place] - [address]  **Let patient know to contact pharmacy at the end of the day to make sure medication is ready. **  ** Please notify patient to allow 48-72 hours to process**  **Encourage patient to contact the pharmacy for refills or they can request refills through [facility]**  CLINICAL FILLS OUT ALL BELOW:   LAST REFILL:  QTY:  REFILL DATE:    OTHER COMMENTS:   THE PHARMACY NEVER RECEIVED THE INITIAL RX    Okay for refill?  Please advise

## 2020-08-05 MED ORDER — MELOXICAM 15 MG PO TABS: 15.0000 mg | ORAL_TABLET | Freq: Every day | ORAL | 0 refills | Status: DC

## 2020-08-05 NOTE — Addendum Note (Signed)
Addended by: Lieutenant Diego A on: 08/05/2020 03:15 PM   Modules accepted: Orders

## 2020-08-05 NOTE — Telephone Encounter (Signed)
Attempted to call [facility] pharmacy on file but they don't open until 9am. I will contact the pharmacy again once they open.

## 2020-08-05 NOTE — Telephone Encounter (Signed)
Pt was prescribed this medication on 07/23/2020. Pt NEVER received medication. This would not be a refill. Pt states he is in a lot of pain and needs the medication. Pt states [facility] told him to have our office call them for the script. Please advise.

## 2020-08-05 NOTE — Telephone Encounter (Signed)
Called and made pt aware and pt would like to hold off on sports med referral for now.

## 2020-08-05 NOTE — Telephone Encounter (Signed)
Resent medication, this Rx was sent on 10/15 when we were having e-scribe issues so it was never sent to pharmacy.

## 2020-08-05 NOTE — Telephone Encounter (Signed)
Unable to get in contact with anyone in the pharmacy. Phone keeps ringing after you choose the pre scriber option and then it take you back to recording you hear when you call. I will attempt to call back later today.

## 2020-08-05 NOTE — Telephone Encounter (Signed)
Pt called the after hours nurse asking about medication refill.

## 2020-08-26 ENCOUNTER — Telehealth: Payer: Self-pay

## 2020-08-26 MED ORDER — MELOXICAM 15 MG PO TABS: 15.0000 mg | ORAL_TABLET | Freq: Every day | ORAL | 2 refills | Status: DC

## 2020-08-26 NOTE — Telephone Encounter (Signed)
Rx sent 

## 2020-08-26 NOTE — Telephone Encounter (Signed)
..   LAST APPOINTMENT DATE: 08/04/2020   NEXT APPOINTMENT DATE:@Visit  date not found  MEDICATION:meloxicam (MOBIC) 15 MG tablet    PHARMACY:[facility] [address], [place] - [address]  Pt states this medication is working, other than he wakes up in the middle of the night with that hot pain on the knee.

## 2020-10-28 ENCOUNTER — Telehealth (INDEPENDENT_AMBULATORY_CARE_PROVIDER_SITE_OTHER): Payer: BC Managed Care – PPO | Admitting: Family Medicine

## 2020-10-28 DIAGNOSIS — U071 COVID-19: Secondary | ICD-10-CM

## 2020-10-28 MED ORDER — BENZONATATE 100 MG PO CAPS: 100.0000 mg | ORAL_CAPSULE | Freq: Three times a day (TID) | ORAL | 0 refills | Status: DC | PRN

## 2020-10-28 NOTE — Progress Notes (Signed)
Virtual Visit via Video Note  I connected with Stacie  on 10/28/20 at 10:00 AM EST by a video enabled telemedicine application and verified that I am speaking with the correct person using two identifiers.  Location patient: home, [place] Location provider:work or home office Persons participating in the virtual visit: patient, provider  I discussed the limitations of evaluation and management by telemedicine and the availability of in person appointments. The patient expressed understanding and agreed to proceed.   HPI:  Acute telemedicine visit for COVID19: -Onset: 10/24/20 -Symptoms include:scratchy throat, runny nose, headaches, body aches, little cough -Denies: fevers, SOB, CP, NVD, inability to get out of bed eat and drink -Has tried: nothing -Pertinent past medical history: Myasthenia gravis in remission - not on any medications for this, obesity -Pertinent medication allergies:musinex made him throw up -COVID-19 vaccine status: fully vaccinated + booster in October  ROS: See pertinent positives and negatives per HPI.  Past Medical History:  Diagnosis Date  . Hypothyroidism   . Myasthenia gravis 1994   in remission since 1996    Past Surgical History:  Procedure Laterality Date  . CHOLECYSTECTOMY N/A 11/26/2017   Procedure: LAPAROSCOPIC CHOLECYSTECTOMY WITH  POSSIBLE INTRAOPERATIVE CHOLANGIOGRAM;  Surgeon: Romie Levee, MD;  Location: [facility];  Service: General;  Laterality: N/A;  . ERCP N/A 11/24/2017   Procedure: ENDOSCOPIC RETROGRADE CHOLANGIOPANCREATOGRAPHY (ERCP);  Surgeon: Iva Boop, MD;  Location: [facility];  Service: Gastroenterology;  Laterality: N/A;  . ERCP N/A 09/19/2018   Procedure: ENDOSCOPIC RETROGRADE CHOLANGIOPANCREATOGRAPHY (ERCP);  Surgeon: Vida Rigger, MD;  Location: [facility];  Service: Endoscopy;  Laterality: N/A;  . REMOVAL OF STONES  09/19/2018   Procedure: REMOVAL OF STONES;  Surgeon: Vida Rigger, MD;  Location: [facility];  Service:  Endoscopy;;  . Dennison Mascot  09/19/2018   Procedure: Dennison Mascot;  Surgeon: Vida Rigger, MD;  Location: [facility];  Service: Endoscopy;;  . STENT REMOVAL  09/19/2018   Procedure: STENT REMOVAL;  Surgeon: Vida Rigger, MD;  Location: [facility];  Service: Endoscopy;;  . TOTAL THYMECTOMY  1994     Current Outpatient Medications:  .  benzonatate (TESSALON PERLES) 100 MG capsule, Take 1 capsule (100 mg total) by mouth 3 (three) times daily as needed., Disp: 20 capsule, Rfl: 0 .  ibuprofen (ADVIL,MOTRIN) 200 MG tablet, Take 400 mg by mouth every 6 (six) hours as needed for headache or moderate pain., Disp: , Rfl:  .  levothyroxine (SYNTHROID) 100 MCG tablet, Take 1 tablet (100 mcg total) by mouth daily., Disp: 90 tablet, Rfl: 0 .  meloxicam (MOBIC) 15 MG tablet, Take 1 tablet (15 mg total) by mouth daily., Disp: 10 tablet, Rfl: 2  EXAM:  VITALS per patient if applicable:  GENERAL: alert, oriented, appears well and in no acute distress  HEENT: atraumatic, conjunttiva clear, no obvious abnormalities on inspection of external nose and ears  NECK: normal movements of the head and neck  LUNGS: on inspection no signs of respiratory distress, breathing rate appears normal, no obvious gross SOB, gasping or wheezing  CV: no obvious cyanosis  MS: moves all visible extremities without noticeable abnormality  PSYCH/NEURO: pleasant and cooperative, no obvious depression or anxiety, speech and thought processing grossly intact  ASSESSMENT AND PLAN:  Discussed the following assessment and plan:  COVID-19  -we discussed possible serious and likely etiologies, options for evaluation and workup, limitations of telemedicine visit vs in person visit, treatment, treatment risks and precautions. Pt prefers to treat via telemedicine empirically rather than in person at  this moment.  Discussed treatment options, potential complications, isolation and precautions.  He opted for symptomatic care at  home with fluids, analgesics if needed, Tessalon prescription sent for cough if needed. Work/School slipped offered: provided in patient instructions   Scheduled follow up with PCP offered: Agrees to follow-up if needed Advised to seek prompt in person care if worsening, new symptoms arise, or if is not improving with treatment. Discussed options for inperson care if PCP office not available. Did let this patient know that I only do telemedicine on Tuesdays and Thursdays for . Advised to schedule follow up visit with PCP or [facility] if any further questions or concerns to avoid delays in care.   I discussed the assessment and treatment plan with the patient. The patient was provided an opportunity to ask questions and all were answered. The patient agreed with the plan and demonstrated an understanding of the instructions.     Terressa Koyanagi, DO

## 2020-10-28 NOTE — Patient Instructions (Addendum)
   ---------------------------------------------------------------------------------------------------------------------------      WORK SLIP:  Patient Christopher Blair,  [id number], was seen for a medical visit today, 10/28/20 . Please excuse from work for a COVID like illness. We advise 10 days minimum from the onset of symptoms (10/24/20) PLUS 1 day of no fever and improved symptoms. Will defer to employer for a sooner return to work if symptoms have resolved, it is greater than 5 days since the positive test and the patient can wear a high-quality, tight fitting mask such as N95 or KN95 at all times for an additional 5 days. Would also suggest COVID19 antigen testing is negative prior to return.  Sincerely: E-signature: Dr. Kriste Basque, DO [facility] Ph: [phone]   ------------------------------------------------------------------------------------------------------------------------------    HOME CARE TIPS:  Dolores Lory COVID19 testing information: [email or web address] [place] [phone] Most pharmacies also offer testing and home test kits.  -I sent the medication(s) we discussed to your pharmacy: Meds ordered this encounter  Medications  . benzonatate (TESSALON PERLES) 100 MG capsule    Sig: Take 1 capsule (100 mg total) by mouth 3 (three) times daily as needed.    Dispense:  20 capsule    Refill:  0    -can use tylenol or aleve if needed for fevers, aches and pains per instructions  -can use nasal saline a few times per day if nasal congestion, sometime a short course of Afrin nasal spray for 3 days can help as well  -stay hydrated, drink plenty of fluids and eat small healthy meals - avoid dairy  -If the Covid test is positive, check out the CDC website for more information on home care, transmission and treatment for COVID19  -follow up with your doctor in 2-3 days unless improving and feeling  better  -stay home while sick, except to seek medical care, and if you have COVID19 please stay home for a full 10 days since the onset of symptoms PLUS one day of no fever and feeling better.  It was nice to meet you today, and I really hope you are feeling better soon. I help [facility] out with telemedicine visits on Tuesdays and Thursdays and am available for visits on those days. If you have any concerns or questions following this visit please schedule a follow up visit with your Primary Care doctor or seek care at a local urgent care clinic to avoid delays in care.    Seek in person care promptly if your symptoms worsen, new concerns arise or you are not improving with treatment. Call 911 and/or seek emergency care if you symptoms are severe or life threatening.

## 2020-11-01 ENCOUNTER — Other Ambulatory Visit: Payer: Self-pay | Admitting: Family Medicine

## 2020-11-03 NOTE — Progress Notes (Signed)
Subjective:    CC: L knee pain and clicking  I, Christopher Blair, LAT, ATC, am serving as scribe for Dr. Clementeen Blair.  HPI: Pt is a 60 y/o male presenting w/ c/o L knee pain and clicking since a few months ago. MOI: possibly when he stepped in a hole while mowing. Pt saw PCP because pain was shooting and waking him up at night. 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 He locates his pain to anterior, medial joint line.  Additionally he notes some chronic right lateral elbow pain that he attributes to the lateral epicondylitis. He has not tried much treatment for this aside from occasional meloxicam. No weakness or numbness distally.  L knee swelling: no L knee mechanical symptoms: yes, L knee clicking reported Aggravating factors: standing, kneeling down Treatments tried: meloxicam  Pertinent review of Systems: No fevers or chills  Relevant historical information: History of gallstone pancreatitis, hyperlipidemia, hypothyroidism.   Objective:    Vitals:   11/04/20 0753  BP: (!) 138/96  Pulse: 69  SpO2: 97%   General: Well Developed, well nourished, and in no acute distress.   MSK: Left knee: Normal-appearing Normal motion with crepitation. Tender palpation medial joint line. Stable ligamentous exam. Intact strength. Positive medial McMurray's test.  Right elbow normal motion normal strength. Tender palpation lateral epicondyle. Pain with resisted wrist extension.  Lab and Radiology Results No results found for this or any previous visit (from the past 72 hour(s)). DG Knee AP/LAT W/Sunrise Left  Result Date: 11/04/2020 CLINICAL DATA:  Medial left knee pain x2 months, worsening now. EXAM: LEFT KNEE 3 VIEWS COMPARISON:  None. FINDINGS: No evidence of fracture, dislocation, or joint effusion. No evidence of arthropathy or other focal bone abnormality. Soft tissues are unremarkable. IMPRESSION: Negative left knee radiograph. Electronically Signed   By:  Christopher Mayhew MD   On: 11/04/2020 08:39   I, Christopher Blair, personally (independently) visualized and performed the interpretation of the images attached in this note.   Procedure: Real-time Ultrasound Guided Injection of left knee superior lateral patellar space Device: Philips Affiniti 50G Images permanently stored and available for review in PACS Ultrasound examination prior to injection reveals no significant joint effusion. Medial joint line reveals partial extruded medial meniscus with visible hypoechoic change within mid substance meniscus indicating tear. Verbal informed consent obtained.  Discussed risks and benefits of procedure. Warned about infection bleeding damage to structures skin hypopigmentation and fat atrophy among others. Patient expresses understanding and agreement Time-out conducted.   Noted no overlying erythema, induration, or other signs of local infection.   Skin prepped in a sterile fashion.   Local anesthesia: Topical Ethyl chloride.   With sterile technique and under real time ultrasound guidance:  40 mg of Kenalog and 2 mL of Marcaine injected into knee joint. Fluid seen entering the joint capsule.   Completed without difficulty   Pain immediately resolved suggesting accurate placement of the medication.   Advised to call if fevers/chills, erythema, induration, drainage, or persistent bleeding.   Images permanently stored and available for review in the ultrasound unit.  Impression: Technically successful ultrasound guided injection.     Impression and Recommendations:    Assessment and Plan: 60 y.o. male with left knee pain thought to be due to medial meniscus tear. Patient does have some mechanical symptoms but the majority of his symptoms are more related to pain. Plan for steroid injection and Voltaren gel. Additionally will proceed with  quad strengthening and weight loss. If not improving will consider MRI for surgical planning.  PDMP not reviewed this  encounter. Orders Placed This Encounter  Procedures  . [name]    Standing Status:   Future    Number of Occurrences:   1    Standing Expiration Date:   05/04/2021    Order Specific Question:   Reason for Exam (SYMPTOM  OR DIAGNOSIS REQUIRED)    Answer:   left knee pain    Order Specific Question:   Preferred imaging location?    Answer:   Adult nurse [facility]  . DG Knee AP/LAT W/Sunrise Left    Standing Status:   Future    Number of Occurrences:   1    Standing Expiration Date:   11/04/2021    Order Specific Question:   Reason for Exam (SYMPTOM  OR DIAGNOSIS REQUIRED)    Answer:   left knee pain    Order Specific Question:   Preferred imaging location?    Answer:   [facility]   No orders of the defined types were placed in this encounter.   Discussed warning signs or symptoms. Please see discharge instructions. Patient expresses understanding.   The above documentation has been reviewed and is accurate and complete Christopher Blair, M.D.

## 2020-11-04 ENCOUNTER — Ambulatory Visit (INDEPENDENT_AMBULATORY_CARE_PROVIDER_SITE_OTHER): Payer: BC Managed Care – PPO

## 2020-11-04 ENCOUNTER — Ambulatory Visit: Payer: Self-pay

## 2020-11-04 ENCOUNTER — Ambulatory Visit: Payer: BC Managed Care – PPO | Admitting: Family Medicine

## 2020-11-04 ENCOUNTER — Other Ambulatory Visit: Payer: Self-pay

## 2020-11-04 VITALS — BP 138/96 | HR 69 | Ht 68.0 in | Wt 222.6 lb

## 2020-11-04 DIAGNOSIS — M25562 Pain in left knee: Secondary | ICD-10-CM

## 2020-11-04 NOTE — Progress Notes (Signed)
Knee x-ray looks normal to radiology. This is reassuring. If not improved MRI will be more helpful.

## 2020-11-04 NOTE — Patient Instructions (Addendum)
Thank you for coming in today.  Please get an Xray today before you leave today.  Please use voltaren gel up to 4x daily for pain as needed.   Keep me updated.    Meniscus Tear  A meniscus tear is a knee injury that happens when a piece of the meniscus is torn. The meniscus is a thick, rubbery, wedge-shaped piece of cartilage in the knee. Each knee has two menisci sitting between the upper bone (femur) and lower bone (tibia) that form the knee joint. Each meniscus acts as a shock absorber for the knee. A torn meniscus is a common knee injury, ranging from mild to severe. Surgery may be needed to repair a severe tear. What are the causes? This condition may be caused by kneeling, squatting, twisting, or pivoting movements. Sports-related injuries are the most common cause, often resulting from:  Running and stopping suddenly.  Changing direction.  Being tackled or knocked off your feet.  Lifting or carrying heavy weights. As people get older, their menisci get thinner and weaker. Tears can happen more easily in older people, for example, when climbing stairs. What increases the risk? You are more likely to develop this condition if you:  Play contact sports.  Have a job that requires kneeling or squatting.  Are male.  Are over 51 years old. What are the signs or symptoms? Symptoms of this condition include:  Knee pain, especially at the side of the knee joint. You may feel pain immediately after injury, or hear a pop and feel pain later.  A feeling that your knee is clicking, catching, locking, or giving way (weakness, instability).  Not being able to fully bend or extend your knee.  Bruising or swelling in your knee. How is this diagnosed? This condition may be diagnosed based on your symptoms and a physical exam. You may also have tests, such as:  X-rays.  MRI.  Arthroscopy. This is a procedure to look inside your knee with a narrow surgical telescope. You may be  referred to a knee specialist (orthopedic surgeon). How is this treated? Treatment for this injury depends on the severity of the tear. Treatment for a mild tear may include:  Rest.  Medicine to reduce pain and swelling, usually a nonsteroidal anti-inflammatory drug (NSAID), like ibuprofen.  A knee brace, sleeve, or wrap.  Using crutches or a walker to keep weight off your knee and help with walking.  Exercises to strengthen your knee (physical therapy). You may need surgery if you have a severe tear or if other treatments fail. Follow these instructions at home: If you have a brace, sleeve, or wrap:  Wear it, as told by your health care provider. Remove it, only as told by your health care provider.  Loosen the brace, sleeve, or wrap if your toes tingle, become numb, or turn cold and blue.  Keep the brace, sleeve, or wrap clean.  If the brace, sleeve, or wrap is not waterproof: ? Do not let it get wet. ? Cover it with a watertight covering when you take a bath or shower. Managing pain, stiffness, and swelling  Take over-the-counter and prescription medicines only as told by your health care provider.  If directed, put ice on your knee. To do this: ? If you have a removable brace, sleeve, or wrap, remove it as told by your health care provider. ? Put ice in a plastic bag. ? Place a towel between your skin and the bag. ? Leave the ice on  for 20 minutes, 2-3 times per day. ? Remove the ice if your skin turns bright red. This is very important. If you cannot feel pain, heat, or cold, you have a greater risk of damage to the area.  Move your toes often to reduce stiffness and swelling.  Raise (elevate) the injured area above the level of your heart while you are sitting or lying down.   Activity  Do not use the injured limb to support your body weight until your health care provider says that you can. Use crutches or a walker as told by your health care provider.  Return to  your normal activities as told by your health care provider. Ask your health care provider what activities are safe for you.  Perform range-of-motion exercises only as told by your health care provider.  Begin doing exercises to strengthen your knee and leg muscles only as told by your health care provider. After you recover, your health care provider may recommend these exercises to help prevent another injury. General instructions  Use a knee brace, sleeve, or wrap as told by your health care provider.  Ask your health care provider when it is safe to drive if you have a brace, sleeve, or wrap on your knee.  Do not use any products that contain nicotine or tobacco, such as cigarettes, e-cigarettes, and chewing tobacco. If you need help quitting, ask your health care provider.  Ask your health care provider if the medicine prescribed to you: ? Requires you to avoid driving or using heavy machinery. ? Can cause constipation. You may need to take these actions to prevent or treat constipation:  Drink enough fluid to keep your urine pale yellow.  Take over-the-counter or prescription medicines.  Eat foods that are high in fiber, such as beans, whole grains, and fresh fruits and vegetables.  Limit foods that are high in fat and processed sugars, such as fried or sweet foods.  Keep all follow-up visits. This is important. Contact a health care provider if:  You have a fever.  Your knee becomes red, tender, or swollen.  Your pain medicine is not controlling your pain.  Your symptoms get worse or do not improve after 2 weeks of home care. Summary  A meniscus tear is a knee injury that happens when a piece of the meniscus is torn.  Treatment for this injury depends on the severity of the tear. You may need surgery if you have a severe tear or if other treatments fail.  Rest, ice, and raise (elevate) your injured knee, as told by your health care provider. This will help lessen pain  and swelling.  Contact a health care provider if you have new symptoms, your symptoms worsen, or they do not improve after 2 weeks of home care.  Keep all follow-up visits. This is important. This information is not intended to replace advice given to you by your health care provider. Make sure you discuss any questions you have with your health care provider. Document Revised: 02/05/2020 Document Reviewed: 02/05/2020 Elsevier Patient Education  2021 [employer].

## 2020-12-06 ENCOUNTER — Telehealth: Payer: Self-pay

## 2020-12-06 NOTE — Telephone Encounter (Signed)
Left a voicemail asking patient to call and get scheduled.   Nurse Assessment Nurse: Melanee Spry, RN, Beth Date/Time (Eastern Time): 12/04/2020 1:45:25 PM Confirm and document reason for call. If symptomatic, describe symptoms. ---Caller states he has a head cold, thinks it is a sinus infection. His cheeks are hurting him and he is congested. Denies fever. Client Directives: # Do not call for Meds after-hours. Does the patient have any new or worsening symptoms? ---Yes Will a triage be completed? ---Yes Related visit to physician within the last 2 weeks? ---No Does the PT have any chronic conditions? (i.e. diabetes, asthma, this includes High risk factors for pregnancy, etc.) ---No Is this a behavioral health or substance abuse call? ---No Guidelines Guideline Title Affirmed Question Affirmed Notes Nurse Date/Time Lamount Cohen Time) Sinus Pain or Congestion [1] SEVERE pain AND [2] not improved 2 hours after pain medicine Newhart, RN, Beth 12/04/2020 1:47:50 PM Disp. Time Lamount Cohen Time) Disposition Final User 12/04/2020 1:48:52 PM See HCP within 4 Hours (or PCP triage) Yes Newhart, RN, Beth Caller Disagree/Comply Disagree PLEASE NOTE: All timestamps contained within this report are represented as [place] Standard Time. CONFIDENTIALTY NOTICE: This fax transmission is intended only for the addressee. It contains information that is legally privileged, confidential or otherwise protected from use or disclosure. If you are not the intended recipient, you are strictly prohibited from reviewing, disclosing, copying using or disseminating any of this information or taking any action in reliance on or regarding this information. If you have received this fax in error, please notify us immediately by telephone so that we can arrange for its return to [place]. Phone: [phone], Toll-Free: [phone], Fax: [phone] Page: 2 of 2 Call Id: [id number] Caller Understands Yes PreDisposition Did not know what  to do Care Advice Given Per Guideline SEE HCP (OR PCP TRIAGE) WITHIN 4 HOURS: * IF OFFICE WILL BE CLOSED AND NO PCP (PRIMARY CARE PROVIDER) SECOND-LEVEL TRIAGE: You need to be seen within the next 3 or 4 hours. A nearby Urgent Care Center [facility]) is often a good source of care. Another choice is to go to the ED. Go sooner if you become worse. PAIN MEDICINES: * ACETAMINOPHEN - REGULAR STRENGTH TYLENOL: Take 650 mg (two 325 mg pills) by mouth every 4 to 6 hours as needed. Each Regular Strength Tylenol pill has 325 mg of acetaminophen. The most you should take each day is 3,250 mg (10 pills a day). * IBUPROFEN (E.G., MOTRIN, ADVIL): Take 400 mg (two 200 mg pills) by mouth every 6 hours. The most you should take each day is 1,200 mg (six 200 mg pills), unless your doctor has told you to take more. CALL BACK IF: * You become worse CARE ADVICE given per Sinus Pain or Congestion (Adult) guideline. Comments User: June Leap, RN Date/Time Lamount Cohen Time): 12/04/2020 1:49:55 PM Patient refused outcome. Stated he will call monday for appt, will not go to an UC. Referrals GO TO FACILITY REFUSE

## 2020-12-06 NOTE — Telephone Encounter (Signed)
Pt scheduled for 03/01/ with Dr. Selena Batten.

## 2020-12-07 ENCOUNTER — Telehealth: Payer: BC Managed Care – PPO | Admitting: Family Medicine

## 2020-12-08 ENCOUNTER — Encounter: Payer: Self-pay | Admitting: Physician Assistant

## 2020-12-08 ENCOUNTER — Telehealth (INDEPENDENT_AMBULATORY_CARE_PROVIDER_SITE_OTHER): Payer: BC Managed Care – PPO | Admitting: Physician Assistant

## 2020-12-08 VITALS — Ht 68.0 in | Wt 215.0 lb

## 2020-12-08 DIAGNOSIS — J0110 Acute frontal sinusitis, unspecified: Secondary | ICD-10-CM | POA: Diagnosis not present

## 2020-12-08 MED ORDER — AMOXICILLIN-POT CLAVULANATE 875-125 MG PO TABS: 1.0000 | ORAL_TABLET | Freq: Two times a day (BID) | ORAL | 0 refills | Status: DC

## 2020-12-08 NOTE — Progress Notes (Signed)
Virtual Visit via Video   I connected with Christopher Blair on 12/08/20 at 10:30 AM EST by a video enabled telemedicine application and verified that I am speaking with the correct person using two identifiers. Location patient: Home Location provider: [facility], Office Persons participating in the virtual visit: Rishik, Tubby PA-C, Corky Mull, LPN   I discussed the limitations of evaluation and management by telemedicine and the availability of in person appointments. The patient expressed understanding and agreed to proceed.  I acted as a Neurosurgeon for [employer], [employer], LPN   Subjective:   HPI:   Sinus problem Pt c/o headache, sinus pressure and facial and teeth pain. Having green nasal drainage. Also having chest congestion, coughing and expectorating clear to green sputum. Started a week ago. Denies fever or chills, SOB. Has done two home COVID tests both Negative. He is having some fatigue, but is still able to go to work. He has trialed: tessalon perles, children's antihistamine and NSAIDs.   ROS: See pertinent positives and negatives per HPI.  Patient Active Problem List   Diagnosis Date Noted  . History of adenomatous polyp of colon 02/07/2018  . Gallstone pancreatitis 11/22/2017  . Hyperlipidemia 07/29/2015  . Family history of MI (myocardial infarction) 10/19/2014  . Hypothyroidism 01/05/2014  . History of myasthenia gravis 02/08/2010    Social History   Tobacco Use  . Smoking status: Never Smoker  . Smokeless tobacco: Never Used  Substance Use Topics  . Alcohol use: Yes    Comment: occasional    Current Outpatient Medications:  .  amoxicillin-clavulanate (AUGMENTIN) 875-125 MG tablet, Take 1 tablet by mouth 2 (two) times daily., Disp: 20 tablet, Rfl: 0 .  benzonatate (TESSALON PERLES) 100 MG capsule, Take 1 capsule (100 mg total) by mouth 3 (three) times daily as needed., Disp: 20 capsule, Rfl: 0 .  ibuprofen  (ADVIL,MOTRIN) 200 MG tablet, Take 400 mg by mouth every 6 (six) hours as needed for headache or moderate pain., Disp: , Rfl:  .  levothyroxine (SYNTHROID) 100 MCG tablet, Take 1 tablet by mouth once daily, Disp: 90 tablet, Rfl: 0 .  meloxicam (MOBIC) 15 MG tablet, Take 1 tablet (15 mg total) by mouth daily., Disp: 10 tablet, Rfl: 2  Allergies  Allergen Reactions  . Mucinex [Guaifenesin Er] Nausea And Vomiting    Objective:   VITALS: Per patient if applicable, see vitals. GENERAL: Alert, appears well and in no acute distress. HEENT: Atraumatic, conjunctiva clear, no obvious abnormalities on inspection of external nose and ears. NECK: Normal movements of the head and neck. CARDIOPULMONARY: No increased WOB. Speaking in clear sentences. I:E ratio WNL.  MS: Moves all visible extremities without noticeable abnormality. PSYCH: Pleasant and cooperative, well-groomed. Speech normal rate and rhythm. Affect is appropriate. Insight and judgement are appropriate. Attention is focused, linear, and appropriate.  NEURO: CN grossly intact. Oriented as arrived to appointment on time with no prompting. Moves both UE equally.  SKIN: No obvious lesions, wounds, erythema, or cyanosis noted on face or hands.  Assessment and Plan:   Hiro was seen today for sinus problem.  Diagnoses and all orders for this visit:  Acute non-recurrent frontal sinusitis  Other orders -     amoxicillin-clavulanate (AUGMENTIN) 875-125 MG tablet; Take 1 tablet by mouth 2 (two) times daily.   No red flags on discussion.  Will initiate augmentin per orders. Discussed taking medications as prescribed. Reviewed return precautions including worsening fever, SOB, worsening cough  or other concerns. Push fluids and rest. I recommend that patient follow-up if symptoms worsen or persist despite treatment x 7-10 days, sooner if needed.  I discussed the assessment and treatment plan with the patient. The patient was provided an  opportunity to ask questions and all were answered. The patient agreed with the plan and demonstrated an understanding of the instructions.   The patient was advised to call back or seek an in-person evaluation if the symptoms worsen or if the condition fails to improve as anticipated.   CMA or LPN served as scribe during this visit. History, Physical, and Plan performed by medical provider. The above documentation has been reviewed and is accurate and complete.  [place], [place] 12/08/2020

## 2020-12-09 ENCOUNTER — Telehealth: Payer: Self-pay

## 2020-12-09 ENCOUNTER — Other Ambulatory Visit: Payer: Self-pay

## 2020-12-09 MED ORDER — DOXYCYCLINE HYCLATE 100 MG PO TABS: 100.0000 mg | ORAL_TABLET | Freq: Two times a day (BID) | ORAL | 0 refills | Status: DC

## 2020-12-09 NOTE — Telephone Encounter (Signed)
Pt states that he had diarrhea from 9 pm- 12 am. He believes the cause is the amoxacillin. Pt has not taken anymore of the medication. Wants to know how to proceed

## 2020-12-09 NOTE — Telephone Encounter (Signed)
Patient is aware of Dr Pamala Hurry comments. Gave a verbal understanding. Is aware that he needs to stop the Augmentin and start the new antibiotic.

## 2020-12-09 NOTE — Telephone Encounter (Signed)
Dr. Durene Cal, see message. Please advise due to Lelon Mast is not here today.

## 2020-12-09 NOTE — Telephone Encounter (Signed)
Called and lm for pt tcb, also sent below message via mychart.

## 2020-12-09 NOTE — Telephone Encounter (Signed)
Have him stop the Augmentin (if he took this with food-if he did not take this with food then can retry with food before taking the below steps).  If he did take Augmentin with food-please remove from medication list.  Have him start doxycycline 100 mg #14 twice daily in its place.  It looks like in the note his symptoms started approximately a week ago-I would also have him wait a full 10 days from symptom onset before starting doxycycline-this would give some time if it is a viral infection as well as give him some time to heal from antibiotic associated diarrhea before starting the doxycycline-if it is already been a full 10 days he can go ahead and start the doxycycline

## 2020-12-22 ENCOUNTER — Telehealth: Payer: Self-pay

## 2020-12-22 NOTE — Telephone Encounter (Signed)
Please advise 

## 2020-12-22 NOTE — Telephone Encounter (Signed)
Pt states he is still experiencing Post nasal drip , after the sinus infection he had. He is wondering if there is anything he can take for it.

## 2020-12-22 NOTE — Telephone Encounter (Signed)
Saline rinses like neil med sinus rinse would be a reasonable choices- needs to follow instructions on packages  Another option would be flonase if that doesn't improve symptoms within a few days. There is a prescription I could try potentially if neither of these work within a week or so

## 2020-12-22 NOTE — Telephone Encounter (Signed)
Unable to reach patient. LMTRC  

## 2020-12-22 NOTE — Telephone Encounter (Signed)
Patient states that he has been using nasal spray and it does help. But the post nasal drip that's causing a cough is the problem. He wants something to clear up the post nasal drip in the back of his throat and the terrible cough he has.

## 2020-12-23 MED ORDER — BENZONATATE 100 MG PO CAPS: 100.0000 mg | ORAL_CAPSULE | Freq: Three times a day (TID) | ORAL | 0 refills | Status: DC | PRN

## 2020-12-23 MED ORDER — IPRATROPIUM BROMIDE 0.06 % NA SOLN: 2.0000 | Freq: Four times a day (QID) | NASAL | 0 refills | Status: DC

## 2020-12-23 NOTE — Telephone Encounter (Signed)
I sent in ipratropium nasal spray for him as well as Tessalon for cough to trial

## 2020-12-23 NOTE — Addendum Note (Signed)
Addended by: Shelva Majestic on: 12/23/2020 07:56 AM   Modules accepted: Orders

## 2020-12-23 NOTE — Telephone Encounter (Signed)
Patient is aware of the medication that's been sent in for him. Gave a verbal understanding.

## 2021-02-01 ENCOUNTER — Other Ambulatory Visit: Payer: Self-pay | Admitting: Family Medicine

## 2021-05-18 NOTE — Progress Notes (Signed)
I, Christopher Blair, LAT, ATC, am serving as scribe for Dr. Clementeen Graham.  [name] is a 60 y.o. male who presents to [facility] today for f/u of L knee pain.  He was last seen by Dr. Denyse Amass on 11/04/20 w/ c/o L knee pain and clicking and had a L knee steroid injection.  Since then, pt reports that his L knee flared up about 1.5 weeks ago in the middle of the night, waking him at 3 am.  He denies any swelling or mechanical symptoms: Aggravating factors include walking and weight-bearing activity.  Diagnostic imaging: L knee XR- 11/04/20   Pertinent review of systems: No fevers or chills  Relevant historical information: History of hypothyroidism and gallstone pancreatitis.   Exam:  BP 120/80 (BP Location: Left Arm, Patient Position: Sitting, Cuff Size: Normal)   Pulse 70   Ht 5\' 8"  (1.727 m)   Wt 218 lb (98.9 kg)   SpO2 95%   BMI 33.15 kg/m  General: Well Developed, well nourished, and in no acute distress.   MSK: Left knee minimal effusion normal motion.  Tender palpation medial joint line.  Positive medial McMurray's test.    Lab and Radiology Results  Procedure: Real-time Ultrasound Guided Injection of left knee superior lateral patellar space Device: Philips Affiniti 50G Images permanently stored and available for review in PACS Ultrasound evaluation reveals a degenerative extruded medial meniscus with a parameniscal cyst. Verbal informed consent obtained.  Discussed risks and benefits of procedure. Warned about infection bleeding damage to structures skin hypopigmentation and fat atrophy among others. Patient expresses understanding and agreement Time-out conducted.   Noted no overlying erythema, induration, or other signs of local infection.   Skin prepped in a sterile fashion.   Local anesthesia: Topical Ethyl chloride.   With sterile technique and under real time ultrasound guidance: 40 mg of methylprednisolone and 2 mL of Marcaine  injected into knee joint. Fluid seen entering the joint capsule.   Completed without difficulty   Pain immediately resolved suggesting accurate placement of the medication.   Advised to call if fevers/chills, erythema, induration, drainage, or persistent bleeding.   Images permanently stored and available for review in the ultrasound unit.  Impression: Technically successful ultrasound guided injection.    EXAM: LEFT KNEE 3 VIEWS   COMPARISON:  None.   FINDINGS: No evidence of fracture, dislocation, or joint effusion. No evidence of arthropathy or other focal bone abnormality. Soft tissues are unremarkable.   IMPRESSION: Negative left knee radiograph.     Electronically Signed   By: MD   On: 11/04/2020 08:39 I, 11/06/2020, personally (independently) visualized and performed the interpretation of the images attached in this note.      Assessment and Plan: 60 y.o. male with left knee pain thought to be due to medial meniscus tear and parameniscal cyst.  Patient does have some mechanical symptoms but they are not severe.  He did well with steroid injection which lasted over 6 months.  Plan for repeat steroid injection today but spent time discussing potential future treatment options.  If not improving next step would probably be MRI for potential surgical planning.   PDMP not reviewed this encounter. Orders Placed This Encounter  Procedures   67 LIMITED JOINT SPACE STRUCTURES LOW LEFT(NO LINKED CHARGES)    Order Specific Question:   Reason for Exam (SYMPTOM  OR DIAGNOSIS REQUIRED)    Answer:   L knee pain  Order Specific Question:   Preferred imaging location?    Answer:   [facility]   No orders of the defined types were placed in this encounter.    Discussed warning signs or symptoms. Please see discharge instructions. Patient expresses understanding.   The above documentation has been reviewed and is accurate and complete  Blair Graham, M.D.

## 2021-05-19 ENCOUNTER — Encounter: Payer: Self-pay | Admitting: Family Medicine

## 2021-05-19 ENCOUNTER — Ambulatory Visit: Payer: BC Managed Care – PPO | Admitting: Family Medicine

## 2021-05-19 ENCOUNTER — Other Ambulatory Visit: Payer: Self-pay

## 2021-05-19 ENCOUNTER — Ambulatory Visit: Payer: Self-pay

## 2021-05-19 VITALS — BP 120/80 | HR 70 | Ht 68.0 in | Wt 218.0 lb

## 2021-05-19 DIAGNOSIS — M25562 Pain in left knee: Secondary | ICD-10-CM

## 2021-05-19 NOTE — Patient Instructions (Signed)
Thank you for coming in today.   Call or go to the ER if you develop a large red swollen joint with extreme pain or oozing puss.    Please use Voltaren gel (Generic Diclofenac Gel) up to 4x daily for pain as needed.  This is available over-the-counter as both the name brand Voltaren gel and the generic diclofenac gel.   Recheck as needed.  Could do a MRI in the future if needed.

## 2021-06-08 ENCOUNTER — Telehealth: Payer: Self-pay | Admitting: Family Medicine

## 2021-06-08 NOTE — Telephone Encounter (Signed)
L knee still hurting. On 8/11 he recd an injection which he believes was Kenalog. He felt better immediately but the pain returned after only 2 weeks. He had a shot in the past that contained Prednisone and it lasted 7 months.  Is there a difference in what his expectations should be based on the difference in meds? Looking for advise as he does not feel any improvement at this time.

## 2021-06-09 NOTE — Telephone Encounter (Signed)
Called pt and LM for him to return call to office (no DPR).  Please relay Dr. Zollie Pee message and offer pt option of knee gel injections or Zilretta as a next step.  Please advise as to which he prefers so insurance authorization process can be started.

## 2021-06-09 NOTE — Telephone Encounter (Signed)
He received an injection on August 11 of methylprednisolone (Depo-Medrol).  In January he received an injection of triamcinolone (Kenalog) these medicines are equivalent and should work about the same.  However you have not had a good response to the most recent injection as it did not last very long.  Options at this point. There is an extended release version of triamcinolone called Zilretta that can last a lot longer that we can try.  Additionally there are those gel shot injections that he may have heard about that we can try.  Both of those will need to be authorized by your insurance company before we can use them.   Let me know what you would like.  I think we can probably help you.

## 2021-06-10 ENCOUNTER — Encounter: Payer: Self-pay | Admitting: Physical Therapy

## 2021-06-10 NOTE — Telephone Encounter (Signed)
Sent pt a MyChart message with Dr. Zollie Pee advice.

## 2021-06-14 ENCOUNTER — Other Ambulatory Visit: Payer: Self-pay | Admitting: Family Medicine

## 2021-06-16 NOTE — Telephone Encounter (Signed)
Patient would like to proceed with Zilretta. Can this be ran through insurance please?

## 2021-06-21 NOTE — Telephone Encounter (Signed)
Faxed order to [facility]

## 2021-06-28 ENCOUNTER — Ambulatory Visit: Payer: Self-pay

## 2021-06-28 ENCOUNTER — Other Ambulatory Visit: Payer: Self-pay

## 2021-06-28 ENCOUNTER — Ambulatory Visit (INDEPENDENT_AMBULATORY_CARE_PROVIDER_SITE_OTHER): Payer: BC Managed Care – PPO | Admitting: Family Medicine

## 2021-06-28 DIAGNOSIS — M1712 Unilateral primary osteoarthritis, left knee: Secondary | ICD-10-CM | POA: Diagnosis not present

## 2021-06-28 DIAGNOSIS — M25562 Pain in left knee: Secondary | ICD-10-CM | POA: Diagnosis not present

## 2021-06-28 NOTE — Patient Instructions (Signed)
Good to see you today.  You had a L knee Zilretta injection today.  Call or go to the ER if you develop a large red swollen joint with extreme pain or oozing puss.   Follow-up as needed.

## 2021-06-28 NOTE — Progress Notes (Signed)
Christopher Blair presents to clinic today for Zilretta injection left knee  Procedure: Real-time Ultrasound Guided Injection of left knee superior lateral patellar space Device: Philips Affiniti 50G Images permanently stored and available for review in PACS Verbal informed consent obtained.  Discussed risks and benefits of procedure. Warned about infection bleeding damage to structures skin hypopigmentation and fat atrophy among others. Patient expresses understanding and agreement Time-out conducted.   Noted no overlying erythema, induration, or other signs of local infection.   Skin prepped in a sterile fashion.   Local anesthesia: Topical Ethyl chloride.   With sterile technique and under real time ultrasound guidance: Zilretta 32 mg injected into knee joint. Fluid seen entering the joint capsule.   Completed without difficulty   Advised to call if fevers/chills, erythema, induration, drainage, or persistent bleeding.   Images permanently stored and available for review in the ultrasound unit.  Impression: Technically successful ultrasound guided injection.  Lot number:[id number]  Return as needed.  If not improved neck step may be hyaluronic acid injection.

## 2021-08-11 ENCOUNTER — Telehealth (INDEPENDENT_AMBULATORY_CARE_PROVIDER_SITE_OTHER): Payer: BC Managed Care – PPO | Admitting: Family Medicine

## 2021-08-11 DIAGNOSIS — R0981 Nasal congestion: Secondary | ICD-10-CM

## 2021-08-11 DIAGNOSIS — R059 Cough, unspecified: Secondary | ICD-10-CM | POA: Diagnosis not present

## 2021-08-11 MED ORDER — BENZONATATE 100 MG PO CAPS: 100.0000 mg | ORAL_CAPSULE | Freq: Three times a day (TID) | ORAL | 0 refills | Status: DC | PRN

## 2021-08-11 MED ORDER — DOXYCYCLINE HYCLATE 100 MG PO TABS: 100.0000 mg | ORAL_TABLET | Freq: Two times a day (BID) | ORAL | 0 refills | Status: DC

## 2021-08-11 NOTE — Patient Instructions (Addendum)
  HOME CARE TIPS:  -COVID19 testing information: [email or web address]  Most pharmacies also offer testing and home test kits. If the Covid19 test is positive and you desire antiviral treatment, please contact a [place] pharmacy or schedule a follow up virtual visit through your primary care office or through the [employer].  Other test to treat options: http://[email or web address]-[email or web address].[email or web address]_[email or web address]  -I sent the medication(s) we discussed to your pharmacy: Meds ordered this encounter  Medications   doxycycline (VIBRA-TABS) 100 MG tablet    Sig: Take 1 tablet (100 mg total) by mouth 2 (two) times daily.    Dispense:  14 tablet    Refill:  0   benzonatate (TESSALON PERLES) 100 MG capsule    Sig: Take 1 capsule (100 mg total) by mouth 3 (three) times daily as needed.    Dispense:  20 capsule    Refill:  0    -can use tylenol or aleve if needed for fevers, aches and pains per instructions  -can use nasal saline a few times per day if you have nasal congestion; sometimes  a short course of Afrin nasal spray for 3 days can help with symptoms as well  -stay hydrated, drink plenty of fluids and eat small healthy meals - avoid dairy  -can take 1000 IU ( ) Vit D3 and 100-500 mg of Vit C daily per instructions  -If the Covid test is positive, check out the [facility] website for more information on home care, transmission and treatment for COVID19  -follow up with your doctor in 2-3 days unless improving and feeling better   It was nice to meet you today, and I really hope you are feeling better soon. I help [place] out with telemedicine visits on Tuesdays and Thursdays and am available for visits on those days. If you have any concerns or questions following this visit please schedule a follow up visit with your Primary Care doctor or seek care at a local urgent care clinic to avoid delays in care.    Seek in person care or schedule a follow up  video visit promptly if your symptoms worsen, new concerns arise or you are not improving with treatment. Call 911 and/or seek emergency care if your symptoms are severe or life threatening.

## 2021-08-11 NOTE — Progress Notes (Signed)
Virtual Visit via Video Note  I connected with Brace  on 08/11/21 at  5:00 PM EDT by a video enabled telemedicine application and verified that I am speaking with the correct person using two identifiers.  Location patient: home, [place] Location provider:work or home office Persons participating in the virtual visit: patient, provider  I discussed the limitations of evaluation and management by telemedicine and the availability of in person appointments. The patient expressed understanding and agreed to proceed.   HPI:  Acute telemedicine visit for upper resp symptoms: -Onset:about 4-5 days ago -Symptoms include: scratchy throat, nasal congestion, pnd, cough -Denies: fever, CP, SOB, NVD, body aches -Pertinent past medical history:see below; reports gets this once per year and it turns into a sinus infection and he needs an abx - is request an abx -Pertinent medication allergies:  Allergies  Allergen Reactions   Mucinex [Guaifenesin Er] Nausea And Vomiting  -COVID-19 vaccine status: Immunization History  Administered Date(s) Administered   Influenza Inj Mdck Quad With Preservative 10/17/2017   Influenza,inj,Quad PF,6+ Mos 07/29/2015, 11/08/2016, 07/23/2020   Influenza-Unspecified 07/18/2017   PFIZER(Purple Top)SARS-COV-2 Vaccination 12/06/2019, 12/27/2019   Td 06/01/2009     ROS: See pertinent positives and negatives per HPI.  Past Medical History:  Diagnosis Date   Hypothyroidism    Myasthenia gravis 1994   in remission since 1996    Past Surgical History:  Procedure Laterality Date   CHOLECYSTECTOMY N/A 11/26/2017   Procedure: LAPAROSCOPIC CHOLECYSTECTOMY WITH  POSSIBLE INTRAOPERATIVE CHOLANGIOGRAM;  Surgeon: Romie Levee, MD;  Location: [facility];  Service: General;  Laterality: N/A;   ERCP N/A 11/24/2017   Procedure: ENDOSCOPIC RETROGRADE CHOLANGIOPANCREATOGRAPHY (ERCP);  Surgeon: Iva Boop, MD;  Location: [facility];  Service: Gastroenterology;  Laterality: N/A;    ERCP N/A 09/19/2018   Procedure: ENDOSCOPIC RETROGRADE CHOLANGIOPANCREATOGRAPHY (ERCP);  Surgeon: Vida Rigger, MD;  Location: [facility];  Service: Endoscopy;  Laterality: N/A;   REMOVAL OF STONES  09/19/2018   Procedure: REMOVAL OF STONES;  Surgeon: Vida Rigger, MD;  Location: [facility];  Service: Endoscopy;;   SPHINCTEROTOMY  09/19/2018   Procedure: Dennison Mascot;  Surgeon: Vida Rigger, MD;  Location: [facility];  Service: Endoscopy;;   STENT REMOVAL  09/19/2018   Procedure: STENT REMOVAL;  Surgeon: Vida Rigger, MD;  Location: [facility];  Service: Endoscopy;;   TOTAL THYMECTOMY  1994     Current Outpatient Medications:    EUTHYROX 100 MCG tablet, Take 1 tablet by mouth once daily, Disp: 90 tablet, Rfl: 0   ibuprofen (ADVIL,MOTRIN) 200 MG tablet, Take 400 mg by mouth every 6 (six) hours as needed for headache or moderate pain., Disp: , Rfl:    ipratropium (ATROVENT) 0.06 % nasal spray, Place 2 sprays into both nostrils 4 (four) times daily., Disp: 15 mL, Rfl: 0   meloxicam (MOBIC) 15 MG tablet, Take 1 tablet (15 mg total) by mouth daily. (Patient not taking: Reported on 05/19/2021), Disp: 10 tablet, Rfl: 2  EXAM:  VITALS per patient if applicable:  GENERAL: alert, oriented, appears well and in no acute distress  HEENT: atraumatic, conjunttiva clear, no obvious abnormalities on inspection of external nose and ears  NECK: normal movements of the head and neck  LUNGS: on inspection no signs of respiratory distress, breathing rate appears normal, no obvious gross SOB, gasping or wheezing  CV: no obvious cyanosis  MS: moves all visible extremities without noticeable abnormality  PSYCH/NEURO: pleasant and cooperative, no obvious depression or anxiety, speech and thought processing grossly intact  ASSESSMENT  AND PLAN:  Discussed the following assessment and plan:  Nasal congestion  Cough, unspecified type  -we discussed possible serious and likely etiologies,  options for evaluation and workup, limitations of telemedicine visit vs in person visit, treatment, treatment risks and precautions. Pt is agreeable to treatment via telemedicine at this moment. Query [facility] vs other. He feels strongly about having an abx on hand incase worsening or doesn't improve because of his past history. Discussed typical course of various viral resp illnesses, signs and symptoms of bacterial illness, potential complications, risks and proper use of abx. He opted to try tessalon for cough and symptomatic care per pt instructions with doxy rx in case worsening or not improving as expected. Advised one more covid test.  Work/School slipped offered:  declined Advised to seek prompt in person care if worsening, new symptoms arise, or if is not improving with treatment. Discussed options for inperson care if PCP office not available. Did let this patient know that I only do telemedicine on Tuesdays and Thursdays for [place]. Advised to schedule follow up visit with PCP or [facility] if any further questions or concerns to avoid delays in care.   I discussed the assessment and treatment plan with the patient. The patient was provided an opportunity to ask questions and all were answered. The patient agreed with the plan and demonstrated an understanding of the instructions.     Terressa Koyanagi, DO

## 2021-09-10 ENCOUNTER — Other Ambulatory Visit: Payer: Self-pay | Admitting: Family Medicine

## 2021-11-15 NOTE — Progress Notes (Signed)
I, Christoper Fabian, LAT, ATC, am serving as scribe for Dr. Clementeen Graham.  Christopher Blair is a 61 y.o. male who presents to [facility] at [facility] today for f/u of L knee pain due to likely medial meniscal pathology.  He was last seen by Dr. Denyse Amass on 06/28/21 and had a L knee Zilretta injection.  Prior to that, he was seen by Dr. Denyse Amass on 05/19/21 for a L knee steroid injection.  Today, pt reports that his L knee pain has been flared up over the last week.  He denies any significant swelling.  He notes increased pain w/ ascending/descending stairs.     In January 2022 he did well with a triamcinolone conventional steroid injection but in August 2022 a methylprednisolone conventional steroid injection did not help much.  Diagnostic testing: L knee XR- 11/04/20  Pertinent review of systems: No fevers or chills  Relevant historical information: Hypothyroidism.  History of myasthenia gravis.   Exam:  BP 112/80 (BP Location: Left Arm, Patient Position: Sitting, Cuff Size: Normal)    Ht 5\' 8"  (1.727 m)    Wt 217 lb 3.2 oz (98.5 kg)    BMI 33.03 kg/m  General: Well Developed, well nourished, and in no acute distress.   MSK: Left knee moderate effusion normal motion with crepitation.  Tender palpation medial joint line. Stable ligamentous exam.   Lab and Radiology Results  Procedure: Real-time Ultrasound Guided Injection of left knee superior lateral patellar space Device: Philips Affiniti 50G Images permanently stored and available for review in PACS Verbal informed consent obtained.  Discussed risks and benefits of procedure. Warned about infection bleeding damage to structures skin hypopigmentation and fat atrophy among others. Patient expresses understanding and agreement Time-out conducted.   Noted no overlying erythema, induration, or other signs of local infection.   Skin prepped in a sterile fashion.   Local anesthesia: Topical Ethyl chloride.   With sterile  technique and under real time ultrasound guidance: 40 mg of Kenalog and 2 mL of Marcaine injected into the knee joint. Fluid seen entering the joint capsule.   Completed without difficulty   Pain immediately resolved suggesting accurate placement of the medication.   Advised to call if fevers/chills, erythema, induration, drainage, or persistent bleeding.   Images permanently stored and available for review in the ultrasound unit.  Impression: Technically successful ultrasound guided injection.   EXAM: LEFT KNEE 3 VIEWS   COMPARISON:  None.   FINDINGS: No evidence of fracture, dislocation, or joint effusion. No evidence of arthropathy or other focal bone abnormality. Soft tissues are unremarkable.   IMPRESSION: Negative left knee radiograph.     Electronically Signed   By: MD   On: 11/04/2020 08:39    I, 11/06/2020, personally (independently) visualized and performed the interpretation of the images attached in this note.      Assessment and Plan: 61 y.o. male with left knee pain thought to be due to DJD.  Plan for repeat steroid injection with triamcinolone.  If this helps provide lasting benefit fantastic.  However if he has temporary relief we will work on authorization now for Zilretta's so we are ready to go with that if needed.  Recheck back as needed.   PDMP not reviewed this encounter. Orders Placed This Encounter  Procedures   77 LIMITED JOINT SPACE STRUCTURES LOW LEFT(NO LINKED CHARGES)    Order Specific Question:   Reason for Exam (SYMPTOM  OR DIAGNOSIS REQUIRED)  Answer:   L knee pain    Order Specific Question:   Preferred imaging location?    Answer:   [facility]   No orders of the defined types were placed in this encounter.    Discussed warning signs or symptoms. Please see discharge instructions. Patient expresses understanding.   The above documentation has been reviewed and is accurate and complete Clementeen Graham, M.D.

## 2021-11-16 ENCOUNTER — Other Ambulatory Visit: Payer: Self-pay

## 2021-11-16 ENCOUNTER — Encounter: Payer: Self-pay | Admitting: Family Medicine

## 2021-11-16 ENCOUNTER — Ambulatory Visit (INDEPENDENT_AMBULATORY_CARE_PROVIDER_SITE_OTHER): Payer: BC Managed Care – PPO | Admitting: Family Medicine

## 2021-11-16 ENCOUNTER — Ambulatory Visit: Payer: Self-pay

## 2021-11-16 VITALS — BP 112/80 | Ht 68.0 in | Wt 217.2 lb

## 2021-11-16 DIAGNOSIS — M25562 Pain in left knee: Secondary | ICD-10-CM | POA: Diagnosis not present

## 2021-11-16 DIAGNOSIS — M1712 Unilateral primary osteoarthritis, left knee: Secondary | ICD-10-CM

## 2021-11-16 NOTE — Patient Instructions (Addendum)
Good to see you today.  You had a L knee steroid injection.  Call or go to the ER if you develop a large red swollen joint with extreme pain or oozing puss.   Follow-up: Zilretta injection once we have it in stock.  We will call you to schedule.

## 2021-11-22 ENCOUNTER — Other Ambulatory Visit: Payer: Self-pay

## 2021-11-22 ENCOUNTER — Ambulatory Visit: Payer: Self-pay

## 2021-11-22 ENCOUNTER — Other Ambulatory Visit: Payer: Self-pay | Admitting: Family Medicine

## 2021-11-22 DIAGNOSIS — M25572 Pain in left ankle and joints of left foot: Secondary | ICD-10-CM

## 2021-11-22 DIAGNOSIS — M542 Cervicalgia: Secondary | ICD-10-CM

## 2021-11-22 DIAGNOSIS — M25531 Pain in right wrist: Secondary | ICD-10-CM

## 2021-11-22 DIAGNOSIS — M25532 Pain in left wrist: Secondary | ICD-10-CM

## 2021-11-24 ENCOUNTER — Other Ambulatory Visit: Payer: Self-pay | Admitting: Family Medicine

## 2022-03-01 ENCOUNTER — Other Ambulatory Visit: Payer: Self-pay | Admitting: Family Medicine

## 2022-04-17 LAB — HM COLONOSCOPY

## 2022-05-23 ENCOUNTER — Other Ambulatory Visit: Payer: Self-pay | Admitting: Family Medicine

## 2022-06-14 ENCOUNTER — Other Ambulatory Visit: Payer: Self-pay | Admitting: Family Medicine

## 2022-06-20 ENCOUNTER — Encounter: Payer: Self-pay | Admitting: Family Medicine

## 2022-06-20 ENCOUNTER — Ambulatory Visit: Payer: BC Managed Care – PPO | Admitting: Family Medicine

## 2022-06-20 VITALS — BP 130/80 | HR 65 | Temp 98.2°F | Ht 68.0 in | Wt 212.0 lb

## 2022-06-20 DIAGNOSIS — E039 Hypothyroidism, unspecified: Secondary | ICD-10-CM | POA: Diagnosis not present

## 2022-06-20 DIAGNOSIS — E785 Hyperlipidemia, unspecified: Secondary | ICD-10-CM

## 2022-06-20 DIAGNOSIS — Z23 Encounter for immunization: Secondary | ICD-10-CM

## 2022-06-20 LAB — COMPREHENSIVE METABOLIC PANEL
ALT: 17 U/L (ref 0–53)
AST: 17 U/L (ref 0–37)
Albumin: 4.3 g/dL (ref 3.5–5.2)
Alkaline Phosphatase: 72 U/L (ref 39–117)
BUN: 16 mg/dL (ref 6–23)
CO2: 28 mEq/L (ref 19–32)
Calcium: 9.5 mg/dL (ref 8.4–10.5)
Chloride: 104 mEq/L (ref 96–112)
Creatinine, Ser: 1.14 mg/dL (ref 0.40–1.50)
GFR: 69.34 mL/min (ref >60.00)
Glucose, Bld: 100 mg/dL — ABNORMAL HIGH (ref 70–99)
Potassium: 4.6 mEq/L (ref 3.5–5.1)
Sodium: 141 mEq/L (ref 135–145)
Total Bilirubin: 1 mg/dL (ref 0.2–1.2)
Total Protein: 7.3 g/dL (ref 6.0–8.3)

## 2022-06-20 LAB — LIPID PANEL
Cholesterol: 217 mg/dL — ABNORMAL HIGH (ref 0–200)
HDL: 44.4 mg/dL (ref >39.00)
LDL Cholesterol: 150 mg/dL — ABNORMAL HIGH (ref 0–99)
NonHDL: 172.54
Total CHOL/HDL Ratio: 5
Triglycerides: 111 mg/dL (ref 0.0–149.0)
VLDL: 22.2 mg/dL (ref 0.0–40.0)

## 2022-06-20 LAB — CBC WITH DIFFERENTIAL/PLATELET
Basophils Absolute: 0.1 10*3/uL (ref 0.0–0.1)
Basophils Relative: 0.8 % (ref 0.0–3.0)
Eosinophils Absolute: 0.3 10*3/uL (ref 0.0–0.7)
Eosinophils Relative: 3.9 % (ref 0.0–5.0)
HCT: 45.2 % (ref 39.0–52.0)
Hemoglobin: 15.2 g/dL (ref 13.0–17.0)
Lymphocytes Relative: 24.1 % (ref 12.0–46.0)
Lymphs Abs: 1.6 10*3/uL (ref 0.7–4.0)
MCHC: 33.6 g/dL (ref 30.0–36.0)
MCV: 92.4 fl (ref 78.0–100.0)
Monocytes Absolute: 0.5 10*3/uL (ref 0.1–1.0)
Monocytes Relative: 7.5 % (ref 3.0–12.0)
Neutro Abs: 4.1 10*3/uL (ref 1.4–7.7)
Neutrophils Relative %: 63.7 % (ref 43.0–77.0)
Platelets: 270 10*3/uL (ref 150.0–400.0)
RBC: 4.89 Mil/uL (ref 4.22–5.81)
RDW: 13 % (ref 11.5–15.5)
WBC: 6.5 10*3/uL (ref 4.0–10.5)

## 2022-06-20 LAB — TSH: TSH: 3.93 u[IU]/mL (ref 0.35–5.50)

## 2022-06-20 MED ORDER — LEVOTHYROXINE SODIUM 100 MCG PO TABS: 100.0000 ug | ORAL_TABLET | Freq: Every day | ORAL | 3 refills | Status: DC

## 2022-06-20 NOTE — Patient Instructions (Addendum)
Let us know the date of your Tdap last with [place]  Please stop by lab before you go If you have mychart- we will send your results within 3 business days of [place] receiving them.  If you do not have mychart- we will call you about results within 5 business days of [place] receiving them.  *please also note that you will see labs on mychart as soon as they post. I will later go in and write notes on them- will say "notes from Dr. Durene Cal"   Consider ct cardiac scoring depending on cholesterol #s  Recommended follow up: Return for next already scheduled visit or sooner if needed.

## 2022-06-20 NOTE — Progress Notes (Signed)
Phone [phone] In person visit   Subjective:   Christopher Blair is a 61 y.o. year old very pleasant male patient who presents for/with See problem oriented charting Chief Complaint  Patient presents with   Medication Refill   Hypothyroidism    Pt is needing levothyroxine refilled.   Past Medical History-  Patient Active Problem List   Diagnosis Date Noted   Hyperlipidemia 07/29/2015    Priority: Medium    Family history of MI (myocardial infarction) 10/19/2014    Priority: Medium    Hypothyroidism 01/05/2014    Priority: Medium    History of adenomatous polyp of colon 02/07/2018    Priority: Low   Gallstone pancreatitis 11/22/2017    Priority: Low   History of myasthenia gravis 02/08/2010    Priority: Low    Medications- reviewed and updated Current Outpatient Medications  Medication Sig Dispense Refill   ibuprofen (ADVIL,MOTRIN) 200 MG tablet Take 400 mg by mouth every 6 (six) hours as needed for headache or moderate pain.     levothyroxine (SYNTHROID) 100 MCG tablet Take 1 tablet by mouth once daily 90 tablet 0   No current facility-administered medications for this visit.     Objective:  BP 130/80   Pulse 65   Temp 98.2 F (36.8 C)   Ht 5\' 8"  (1.727 m)   Wt 212 lb (96.2 kg)   SpO2 95%   BMI 32.23 kg/m  Gen: NAD, resting comfortably CV: RRR no murmurs rubs or gallops Lungs: CTAB no crackles, wheeze, rhonchi Ext: no edema Skin: warm, dry    Assessment and Plan   #Injury after fall backwards in chair at work February of 2023- flipped onto cement floor- multiple injuries in upper (wrist fracture) and lower body(left meniscus tear) and ongoing pain for 8 months. Going for dry needling. Working with Dr. 04-29-1987 to try to get through these issues. Has some headaches after this- neck pain issues related to this. Also had traction last week for tension and neck- helped some.  -taking ibuprofen 600 mg or 800 mg as needed  Wt Readings from Last 3  Encounters:  06/20/22 212 lb (96.2 kg)  11/16/21 217 lb 3.2 oz (98.5 kg)  05/19/21 218 lb (98.9 kg)   #hypothyroidism S: compliant On thyroid medication- levothyroxine 100 mcg  Lab Results  Component Value Date   TSH 3.32 07/23/2020  A/P:hopefully stable- update tsh today. Continue current meds for now   #hyperlipidemia S: Medication:none  The 10-year ASCVD risk score (Arnett DK, et al., 2019) is: 12.9% Lab Results  Component Value Date   CHOL 222 (H) 07/23/2020   HDL 37 (L) 07/23/2020   LDLCALC 139 (H) 07/23/2020   LDLDIRECT 105.0 10/27/2015   TRIG 324 (H) 07/23/2020   CHOLHDL 6.0 (H) 07/23/2020   A/P: with dads history CAD at 26- considered CT cardiac scoring- he wants to update #s first and then decide after we see updated risk  #PSA screening-wants to wait for CPE in february Lab Results  Component Value Date   PSA 0.76 01/01/2019   PSA 0.89 11/08/2016   PSA 0.67 10/27/2015   Recommended follow up: Return for next already scheduled visit or sooner if needed. Future Appointments  Date Time Provider Department Center  11/29/2022  9:20 AM 12/01/2022, MD [facility]    Lab/Order associations: FASTING   ICD-10-CM   1. Hyperlipidemia, unspecified hyperlipidemia type  E78.5 CBC with Differential/Platelet    Comprehensive metabolic panel    Lipid panel  2. Hypothyroidism, unspecified type  E03.9 TSH    3. Need for immunization against influenza  Z23 Flu Vaccine QUAD 64mo+IM (Fluarix, Fluzone & Alfiuria Quad PF)     Return precautions advised.  [name], MD

## 2022-07-26 ENCOUNTER — Telehealth: Payer: Self-pay

## 2022-07-26 NOTE — Telephone Encounter (Signed)
Patient called stating he had Dropped of an X ray disk to Dr. Georgina Snell back in February or march for him to look at and compare to his x ray that we had done in our office. Patient states that he had not heard anything since then and was calling to see if there was anything that Dr. Georgina Snell had seen on that disk that he dropped off to the front desk. Patient would like a call back.

## 2022-07-27 NOTE — Telephone Encounter (Signed)
Left knee x-ray from May 2023 shows mild medial DJD.  We do have comparison views of the right knee which do show a little bit of arthritis but less on the right than the left.  Overall arthritis is mild.  No acute fractures are visible. CD is available for pickup at the front desk.  My apologies for the delay in response. Compared to your x-ray from 2022 I would say you have had slight progression of arthritis but you do not have severe arthritis per my interpretation of the x-rays obtained May of this year.

## 2022-08-04 NOTE — Telephone Encounter (Signed)
LVM for patient to call back for Dr. Clovis Riley results below

## 2022-08-04 NOTE — Telephone Encounter (Signed)
Patient called back. Gave Dr Christopher Blair message.  He wanted to know if there were any changes in his meniscus?  Please advise.

## 2022-08-08 NOTE — Telephone Encounter (Signed)
That would be hard to tell without an MRI. If the arthritis has gotten a tad worse it is reasonable to presume the meniscus has gotten a little worse as well but really we would need an MRI to tell for sure.

## 2022-08-08 NOTE — Telephone Encounter (Signed)
Message sent to patient via MyChart. 

## 2022-10-19 IMAGING — DX DG KNEE AP/LAT W/ SUNRISE*L*
3 series · 3 of 3 positions shown · non-contrast
Comparison: None.

CLINICAL DATA: Medial left knee pain x2 months, worsening now.

EXAM:
LEFT KNEE 3 VIEWS

[knee ap]
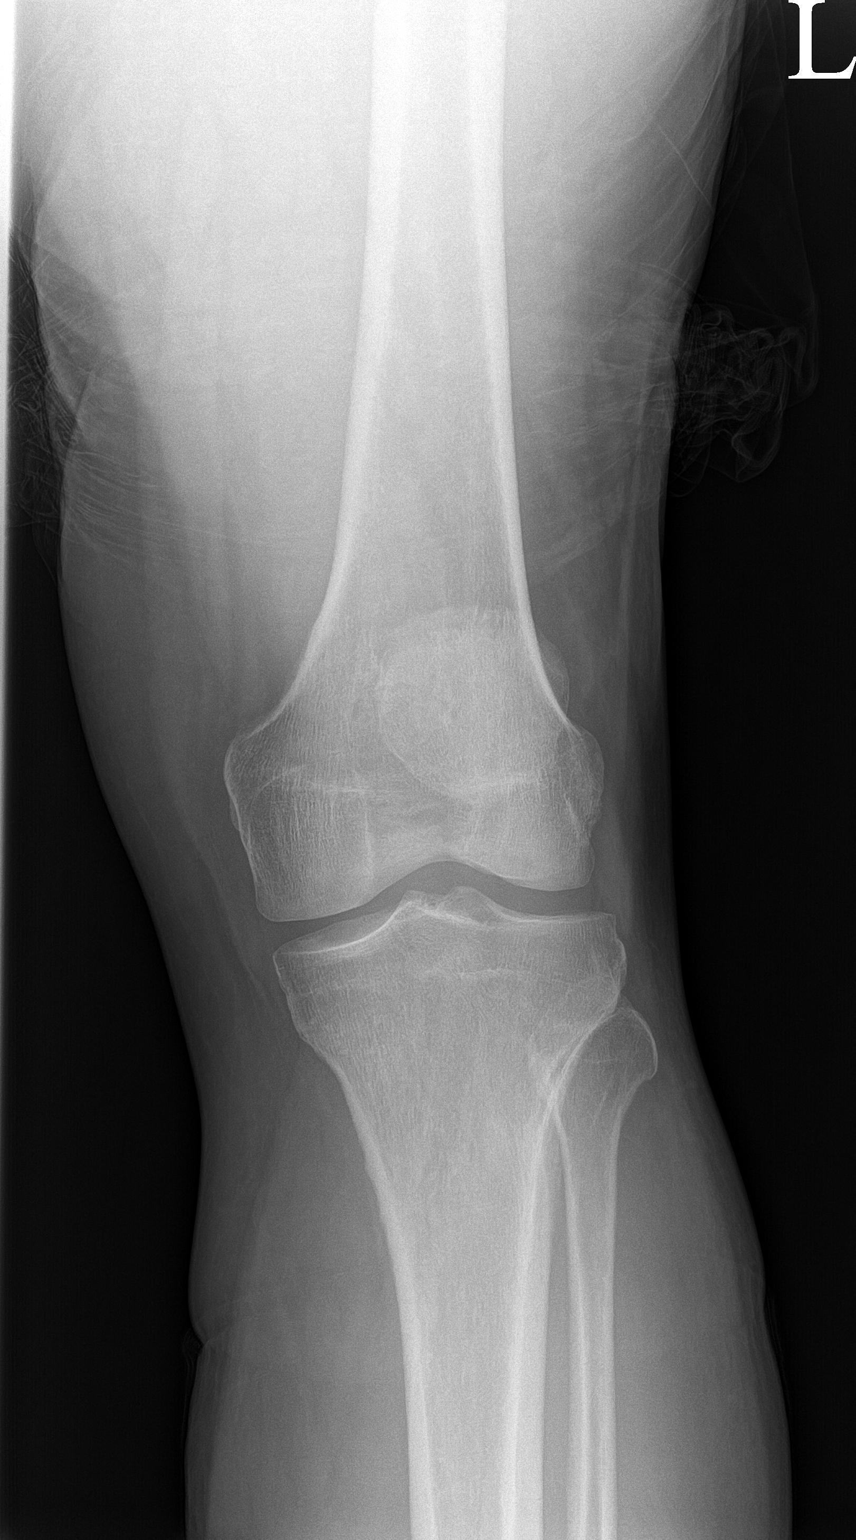

[knee lat]
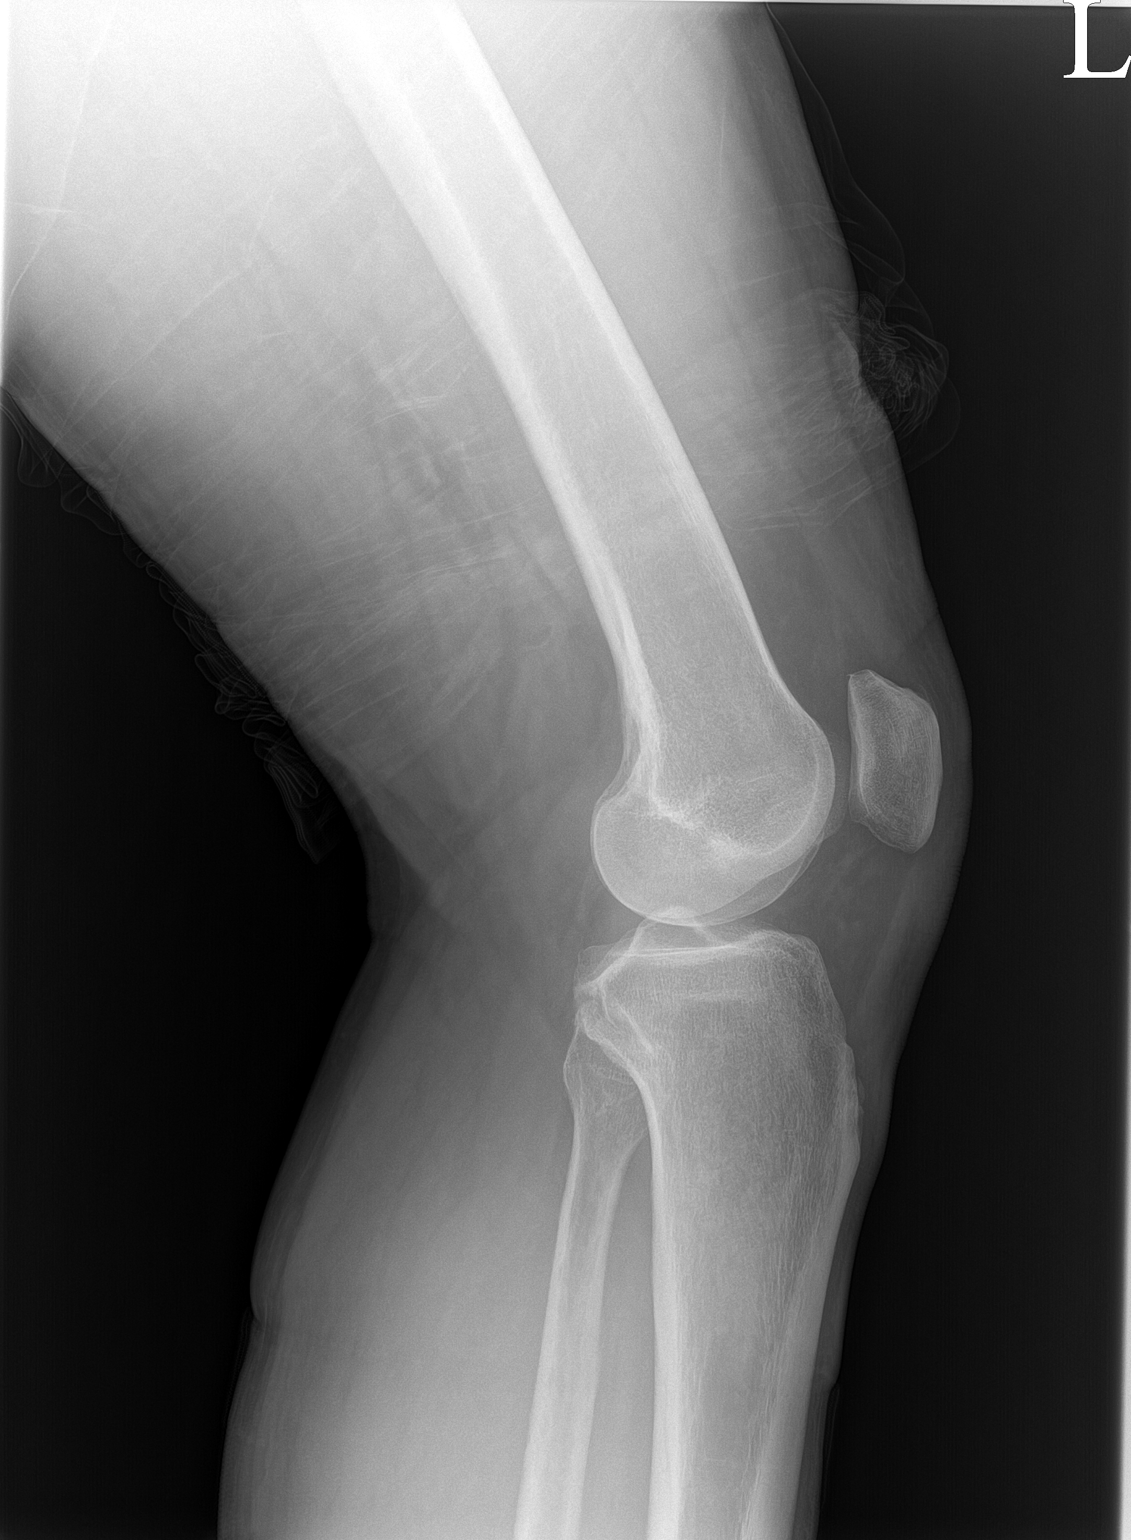

[patella]
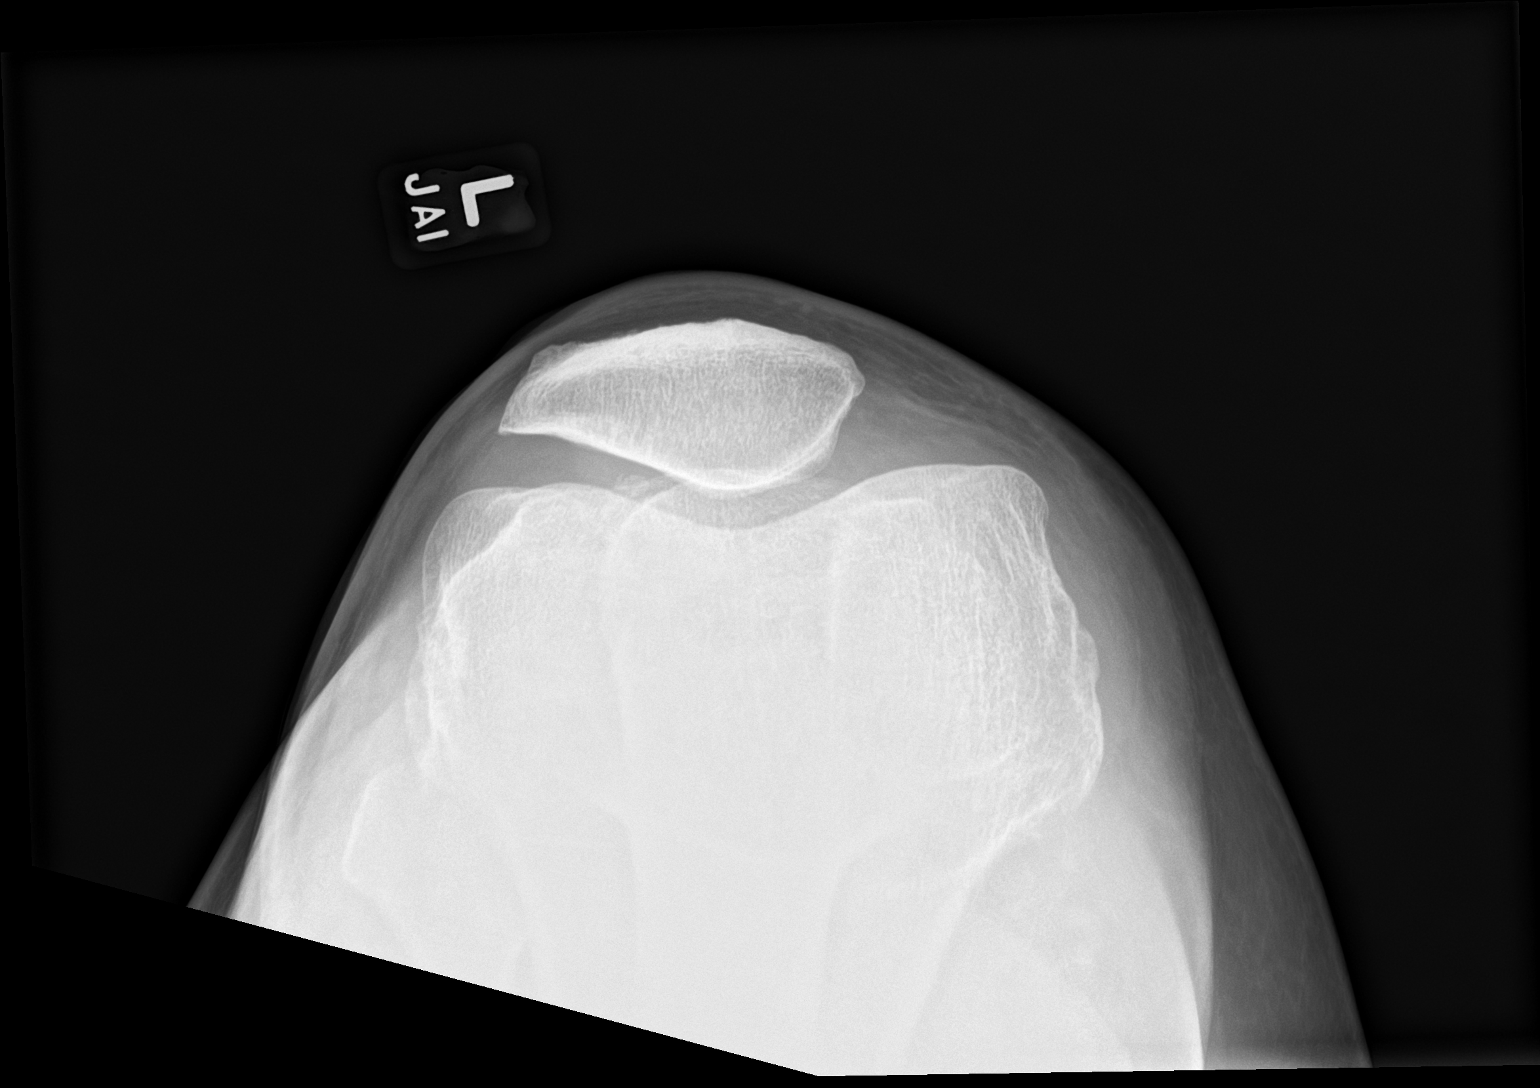

[3 of 3 positions shown; findings below may reference images not displayed]

FINDINGS: No evidence of fracture, dislocation, or joint effusion. No evidence
of arthropathy or other focal bone abnormality. Soft tissues are
unremarkable.
IMPRESSION: Negative left knee radiograph.

## 2022-11-29 ENCOUNTER — Ambulatory Visit (INDEPENDENT_AMBULATORY_CARE_PROVIDER_SITE_OTHER): Payer: BC Managed Care – PPO | Admitting: Family Medicine

## 2022-11-29 ENCOUNTER — Encounter: Payer: Self-pay | Admitting: Family Medicine

## 2022-11-29 VITALS — BP 118/80 | HR 70 | Temp 98.0°F | Ht 68.0 in | Wt 219.0 lb

## 2022-11-29 DIAGNOSIS — R739 Hyperglycemia, unspecified: Secondary | ICD-10-CM

## 2022-11-29 DIAGNOSIS — E785 Hyperlipidemia, unspecified: Secondary | ICD-10-CM

## 2022-11-29 DIAGNOSIS — E039 Hypothyroidism, unspecified: Secondary | ICD-10-CM | POA: Diagnosis not present

## 2022-11-29 DIAGNOSIS — Z0000 Encounter for general adult medical examination without abnormal findings: Secondary | ICD-10-CM | POA: Diagnosis not present

## 2022-11-29 DIAGNOSIS — Z23 Encounter for immunization: Secondary | ICD-10-CM

## 2022-11-29 DIAGNOSIS — Z125 Encounter for screening for malignant neoplasm of prostate: Secondary | ICD-10-CM | POA: Diagnosis not present

## 2022-11-29 LAB — HEMOGLOBIN A1C: Hgb A1c MFr Bld: 6.3 % (ref 4.6–6.5)

## 2022-11-29 LAB — TSH: TSH: 2.26 u[IU]/mL (ref 0.35–5.50)

## 2022-11-29 LAB — PSA: PSA: 1.04 ng/mL (ref 0.10–4.00)

## 2022-11-29 NOTE — Patient Instructions (Addendum)
Tdap today  Shingrix #1 today. Repeat injection in 2-6 months. Schedule a nurse visit for the 2nd injection before you leave today (at the check out desk)  Please stop by lab before you go If you have mychart- we will send your results within 3 business days of [place] receiving them.  If you do not have mychart- we will call you about results within 5 business days of [place] receiving them.  *please also note that you will see labs on mychart as soon as they post. I will later go in and write notes on them- will say "notes from Dr. Yong Channel"    Recommended follow up: Return in about 1 year (around 11/30/2023) for physical or sooner if needed.Schedule b4 you leave.

## 2022-11-29 NOTE — Addendum Note (Signed)
Addended by: Wyvonna Plum on: 11/29/2022 10:19 AM   Modules accepted: Orders

## 2022-11-29 NOTE — Progress Notes (Signed)
Phone: [phone]   Subjective:  Patient presents today for their annual physical. Chief complaint-noted.   See problem oriented charting- ROS- full  review of systems was completed and negative  except for: joint pain, back pain, nekc pain and stiffness, still with headaches after fall, numbness into both hands that comes and goes  The following were reviewed and entered/updated in epic: Past Medical History:  Diagnosis Date   Hypothyroidism    Myasthenia gravis 1994   in remission since 1996   Patient Active Problem List   Diagnosis Date Noted   Hyperlipidemia 07/29/2015    Priority: Medium    Family history of MI (myocardial infarction) 10/19/2014    Priority: Medium    Hypothyroidism 01/05/2014    Priority: Medium    History of adenomatous polyp of colon 02/07/2018    Priority: Low   Gallstone pancreatitis 11/22/2017    Priority: Low   History of myasthenia gravis 02/08/2010    Priority: Low   Past Surgical History:  Procedure Laterality Date   CHOLECYSTECTOMY N/A 11/26/2017   Procedure: LAPAROSCOPIC CHOLECYSTECTOMY WITH  POSSIBLE INTRAOPERATIVE CHOLANGIOGRAM;  Surgeon: Leighton Ruff, MD;  [facility]: [facility];  Service: General;  Laterality: N/A;   ERCP N/A 11/24/2017   Procedure: ENDOSCOPIC RETROGRADE CHOLANGIOPANCREATOGRAPHY (ERCP);  Surgeon: Gatha Mayer, MD;  Location: [facility];  Service: Gastroenterology;  Laterality: N/A;   ERCP N/A 09/19/2018   Procedure: ENDOSCOPIC RETROGRADE CHOLANGIOPANCREATOGRAPHY (ERCP);  Surgeon: Clarene Essex, MD;  Location: [facility];  Service: Endoscopy;  Laterality: N/A;   REMOVAL OF STONES  09/19/2018   Procedure: REMOVAL OF STONES;  Surgeon: Clarene Essex, MD;  Location: [facility];  Service: Endoscopy;;   SPHINCTEROTOMY  09/19/2018   Procedure: Joan Mayans;  Surgeon: Clarene Essex, MD;  [facility]: [facility];  Service: Endoscopy;;   STENT REMOVAL  09/19/2018   Procedure: STENT REMOVAL;  Surgeon: Clarene Essex, MD;  Location:  [facility];  Service: Endoscopy;;   TOTAL THYMECTOMY  1994    Family History  Problem Relation Age of Onset   Cancer Mother 30       breast ca   Heart disease Father        AMI-61, nonsmoker, died 26   Migraines Son        and tics   Anxiety disorder Daughter     Medications- reviewed and updated Current Outpatient Medications  Medication Sig Dispense Refill   gabapentin (NEURONTIN) 100 MG capsule Take 100-200 mg by mouth at bedtime.     levothyroxine (SYNTHROID) 100 MCG tablet Take 1 tablet (100 mcg total) by mouth daily. 90 tablet 3   ibuprofen (ADVIL,MOTRIN) 200 MG tablet Take 400 mg by mouth every 6 (six) hours as needed for headache or moderate pain. (Patient not taking: Reported on 11/29/2022)     No current facility-administered medications for this visit.    Allergies-reviewed and updated Allergies  Allergen Reactions   Mucinex [Guaifenesin Er] Nausea And Vomiting    Social History   Social History Narrative   Married. 3 children.  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 Prior Full time substitute [employer] full term   prior for [employer].  Works side jobs as well.       Hobbies: essentially no hobbies as works frequently   Goes to [employer]    Objective  Objective:  BP 118/80   Pulse 70   Temp 98 F (36.7 C)   Ht 5' 8"$  (1.727 m)   Wt 219 lb (99.3 kg)   SpO2 96%   BMI 33.30 kg/m  Gen: NAD, resting comfortably HEENT: Mucous membranes are moist. Oropharynx normal Neck: no thyromegaly CV: RRR no murmurs rubs or gallops Lungs: CTAB no crackles, wheeze, rhonchi Abdomen: soft/nontender/nondistended/normal bowel sounds. No rebound or guarding.  Ext: no edema Skin: warm, dry Neuro: grossly normal, moves all extremities, PERRLA   Assessment and Plan  62 y.o. male presenting for  annual physical.  Health Maintenance counseling: 1. Anticipatory guidance: Patient counseled regarding regular dental exams -advised q6 months- has missed lately, eye exams -yearly- seen in summer- sounds like needs adjustments though,  avoiding smoking and second hand smoke, limiting alcohol to 2 beverages per day - well under this, no illicit drugs .   2. Risk factor reduction:  Advised patient of need for regular exercise and diet rich and fruits and vegetables to reduce risk of heart attack and stroke.  Exercise- limited other than PT with pain related issues.  Diet/weight management-up 7 lbs from September but has had to have steroids on multiple occasions and actually down 10 lbs from peak.  Wt Readings from Last 3 Encounters:  11/29/22 219 lb (99.3 kg)  06/20/22 212 lb (96.2 kg)  11/16/21 217 lb 3.2 oz (98.5 kg)   3. Immunizations/screenings/ancillary studies- tday today, shingrix #1 today Immunization History  Administered Date(s) Administered   Influenza Inj Mdck Quad With Preservative 10/17/2017   Influenza,inj,Quad PF,6+ Mos 07/29/2015, 11/08/2016, 07/23/2020, 06/20/2022   Influenza-Unspecified 07/18/2017   PFIZER(Purple Top)SARS-COV-2 Vaccination 12/06/2019, 12/27/2019   Td 06/01/2009   Zoster Recombinat (Shingrix) 11/29/2022  4. Prostate cancer screening- low risk prior psa trend- update with labs. Declines rectal   Lab Results  Component Value Date   PSA 0.76 01/01/2019   PSA 0.89 11/08/2016   PSA 0.67 10/27/2015   5. Colon cancer screening - 04/17/22 with 5 year repeat per GI  6. Skin cancer screening- no dermatologist. advised regular sunscreen use. Denies worrisome, changing, or new skin lesions.  7. Smoking associated screening (lung cancer screening, AAA screen 65-75, UA)- never smoker 8. STD screening - only active with wife  Status of chronic or acute concerns   #social update- daughter getting married in may!  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 Sleep issues related to his neck- taking gabapentin for nerve pain issues through Dr. Rip Harbour -Knee pain- had 3rd gel injection with Dr. Rip Harbour -has another visit on Friday and will have them send [place] notes -also needed injections at his thumb- still having trouble lifting pan -has not catered since December- has only gotten up to 10 lbs lifting- higher levels gets burning int he back  #hyperlipidemia S: Medication:none Lab Results  Component Value Date   CHOL 217 (H) 06/20/2022   HDL 44.40 06/20/2022   LDLCALC 150 (H) 06/20/2022   LDLDIRECT 105.0 10/27/2015   TRIG 111.0 06/20/2022   CHOLHDL 5 06/20/2022   A/P: discussed with family history Dad with CAD at 66 doing Ct cardiac scoring- he wants to hold for now- can discuss again next year  #hypothyroidism S: compliant On thyroid medication-levothyroxine 100 mcg  Lab Results  Component Value Date   TSH 3.93 06/20/2022    A/P:stable- continue current medicines    #Hyperglycemia- last 3 sugars slightly high-  will check a1c  Recommended follow up: Return in about 1 year (around 11/30/2023) for physical or sooner if needed.Schedule b4 you leave.  Lab/Order associations: fasting   ICD-10-CM   1. Preventative health care  Z00.00 Comprehensive metabolic panel    PSA    TSH    HgB A1c    2. Need for shingles vaccine  Z23 Zoster Recombinant (Shingrix )    3. Hyperlipidemia, unspecified hyperlipidemia type  E78.5 Comprehensive metabolic panel    TSH    4. Hypothyroidism, unspecified type  E03.9 TSH    5. Screening for prostate cancer  Z12.5 PSA    6. Hyperglycemia  R73.9 HgB A1c      No orders of the defined types were placed in this encounter.   Return precautions advised.  Garret Reddish, MD

## 2022-11-30 LAB — COMPREHENSIVE METABOLIC PANEL
ALT: 21 U/L (ref 0–53)
AST: 20 U/L (ref 0–37)
Albumin: 4.6 g/dL (ref 3.5–5.2)
Alkaline Phosphatase: 77 U/L (ref 39–117)
BUN: 14 mg/dL (ref 6–23)
CO2: 25 mEq/L (ref 19–32)
Calcium: 9.8 mg/dL (ref 8.4–10.5)
Chloride: 106 mEq/L (ref 96–112)
Creatinine, Ser: 1 mg/dL (ref 0.40–1.50)
GFR: 80.9 mL/min (ref >60.00)
Glucose, Bld: 103 mg/dL — ABNORMAL HIGH (ref 70–99)
Potassium: 4.6 mEq/L (ref 3.5–5.1)
Sodium: 143 mEq/L (ref 135–145)
Total Bilirubin: 1.2 mg/dL (ref 0.2–1.2)
Total Protein: 7.2 g/dL (ref 6.0–8.3)

## 2023-08-10 ENCOUNTER — Encounter: Payer: Self-pay | Admitting: Family Medicine

## 2023-08-10 ENCOUNTER — Ambulatory Visit: Payer: BC Managed Care – PPO | Admitting: Family Medicine

## 2023-08-10 VITALS — BP 130/81 | HR 60 | Temp 98.0°F | Ht 68.0 in | Wt 218.6 lb

## 2023-08-10 DIAGNOSIS — R059 Cough, unspecified: Secondary | ICD-10-CM

## 2023-08-10 LAB — POC COVID19 BINAXNOW: SARS Coronavirus 2 Ag: NEGATIVE

## 2023-08-10 MED ORDER — AZITHROMYCIN 250 MG PO TABS: ORAL_TABLET | ORAL | 0 refills | Status: DC

## 2023-08-10 NOTE — Progress Notes (Signed)
   Christopher Blair is a 62 y.o. male who presents today for an office visit.  Assessment/Plan:  Cough Covid negative. No red flags signs or symptoms. Likely viral URI.  He will continue taking Atrovent.  He can continue over-the-counter medications.  Will send in pocket prescription for azithromycin with instruction to not start unless symptoms worsen or fail to improve over the next several days.  We encouraged hydration.  Discussed reasons to return to care.  Follow-up as needed.    Subjective:  HPI:  See assessment / plan for status of chronic conditions. He is here today with cough and congestion. This started 4 days ago. He has had some wheezing at night. No fevers or chills. No chest pain or shortness of breath.  Symptoms about the same the last few days. Tried taking vicks with some improvement. Also tried motrin and cough drops which have helped.  Has been using Atrovent that he got last year with modest improvement.        Objective:  Physical Exam: There were no vitals taken for this visit.  Gen: No acute distress, resting comfortably HEENT: TMs clear.  OP erythematous. CV: Regular rate and rhythm with no murmurs appreciated Pulm: Normal work of breathing, clear to auscultation bilaterally with no crackles, wheezes, or rhonchi Neuro: Grossly normal, moves all extremities Psych: Normal affect and thought content      Monte Zinni M. Jimmey Ralph, MD 08/10/2023 8:32 AM

## 2023-08-10 NOTE — Patient Instructions (Signed)
It was very nice to see you today!  Your COVID test is negative.  Please continue taking the nasal spray.  Start antibiotic if not improving  Return if symptoms worsen or fail to improve.   Take care, Dr Jimmey Ralph  PLEASE NOTE:  If you had any lab tests, please let us know if you have not heard back within a few days. You may see your results on mychart before we have a chance to review them but we will give you a call once they are reviewed by [place].   If we ordered any referrals today, please let us know if you have not heard from their office within the next week.   If you had any urgent prescriptions sent in today, please check with the pharmacy within an hour of our visit to make sure the prescription was transmitted appropriately.   Please try these tips to maintain a healthy lifestyle:  Eat at least 3 REAL meals and 1-2 snacks per day.  Aim for no more than 5 hours between eating.  If you eat breakfast, please do so within one hour of getting up.   Each meal should contain half fruits/vegetables, one quarter protein, and one quarter carbs (no bigger than a computer mouse)  Cut down on sweet beverages. This includes juice, soda, and sweet tea.   Drink at least 1 glass of water with each meal and aim for at least 8 glasses per day  Exercise at least 150 minutes every week.

## 2023-08-24 ENCOUNTER — Encounter: Payer: Self-pay | Admitting: Family Medicine

## 2023-08-24 ENCOUNTER — Other Ambulatory Visit: Payer: Self-pay

## 2023-08-24 ENCOUNTER — Ambulatory Visit: Payer: BC Managed Care – PPO | Admitting: Family Medicine

## 2023-08-24 VITALS — BP 138/89 | HR 71 | Temp 98.0°F | Ht 68.0 in | Wt 221.0 lb

## 2023-08-24 DIAGNOSIS — M47812 Spondylosis without myelopathy or radiculopathy, cervical region: Secondary | ICD-10-CM | POA: Diagnosis not present

## 2023-08-24 DIAGNOSIS — Z23 Encounter for immunization: Secondary | ICD-10-CM

## 2023-08-24 DIAGNOSIS — E039 Hypothyroidism, unspecified: Secondary | ICD-10-CM | POA: Diagnosis not present

## 2023-08-24 MED ORDER — CYCLOBENZAPRINE HCL 10 MG PO TABS: 10.0000 mg | ORAL_TABLET | Freq: Three times a day (TID) | ORAL | 0 refills | Status: DC | PRN

## 2023-08-24 MED ORDER — LEVOTHYROXINE SODIUM 100 MCG PO TABS: 100.0000 ug | ORAL_TABLET | Freq: Every day | ORAL | 3 refills | Status: DC

## 2023-08-24 NOTE — Progress Notes (Signed)
Phone [phone] In person visit   Subjective:   Christopher Blair is a 62 y.o. year old very pleasant male patient who presents for/with See problem oriented charting Chief Complaint  Patient presents with   Shoulder Pain   Neck Pain    Pt c/o of nick stiffness since Saturday, pt has tried pain medication from past surgery that did not help, Motrin and Tyenol helped best.    Past Medical History-  Patient Active Problem List   Diagnosis Date Noted   Hyperlipidemia 07/29/2015    Priority: Medium    Family history of MI (myocardial infarction) 10/19/2014    Priority: Medium    Hypothyroidism 01/05/2014    Priority: Medium    History of adenomatous polyp of colon 02/07/2018    Priority: Low   Gallstone pancreatitis 11/22/2017    Priority: Low   History of myasthenia gravis 02/08/2010    Priority: Low    Medications- reviewed and updated Current Outpatient Medications  Medication Sig Dispense Refill   ibuprofen (ADVIL,MOTRIN) 200 MG tablet Take 400 mg by mouth every 6 (six) hours as needed for headache or moderate pain (pain score 4-6).     levothyroxine (SYNTHROID) 100 MCG tablet Take 1 tablet (100 mcg total) by mouth daily. 90 tablet 3   No current facility-administered medications for this visit.     Objective:  BP 138/89   Pulse 71   Temp 98 F (36.7 C)   Ht 5\' 8"  (1.727 m)   Wt 221 lb (100.2 kg)   SpO2 96%   BMI 33.60 kg/m  Gen: NAD, resting comfortably CV: RRR no murmurs rubs or gallops Lungs: CTAB no crackles, wheeze, rhonchi Abdomen: soft/nontender/nondistended/normal bowel sounds. No rebound or guarding.  Ext: no edema Skin: warm, dry Neuro: grossly normal, moves all extremities Neck exam- Normal skin, Spine with normal alignment and no deformity.  No tenderness to vertebral process palpation.  Paraspinous muscles are extremely tender and spasm noted.  Range of motion limited-can turn head to the side to approximately 30 degrees in both directions  before pain prohibits further motion.  Good strength in bilateral upper extremities.   Normal gait    Assessment and Plan   # Shoulder and neck pain after waking up 6 days ago S: Patient started with neck stiffness approximately 6 days ago on Saturday. When he woke up on Saturday it hurt from left neck into left shoulder and into the arm and even left upper back- felt extremely stiff. No fall or injury or strenuous work in days prior. Only thing he can think of is fell asleep awkwardly with right side down but somewhat twisted with his body. Has avoided lifting since last year with his injury with fall in early 2023- has to hire people to do things around his home. Working with [facility] orth[facility]ics but could not be seen for another 12 days- reports his issues have been related to workman's compensation from the prior injury/fall in 2023.    He had some pain medication from a prior surgery which was not helpful (from knee surgery- hydrocodone)   Also tried Motrin and Tylenol and does seem to help more. Without medicine pain 6/10 and with movement much higher, with medicine down to 4-5/10. Has not tried muscle relaxant. Pain has shifted more to the right of the neck but hurts in both shoulders. No pain down into the arms at this point. Stable paresthesias back to 2023- not worse with this. No incontinence. Feels arms are  weaker than usual with this. Has not dropped anything. He has upcoming steroid injection to the neck. Does hurt worse when works in morning.  Ibuprofen is 600mg  and tylenol has done up to 1500mg - advised 1000mg  and 3000mg  per day. Heat helps some and using a balm.  No chest pain or shortness of breath reported  He does have cervical spine imaging from 11/22/2021 (fall at that time and had imaging and had 3 pinched nerves with [facility])  Which showed the following "Multilevel degenerative disc disease, worst at C6-C7.  Moderate to severe multilevel facet arthropathy.  Trace  degenerative anterolisthesis at C5-C6."  Still with chronic pain in right thumb with prior fracture- has discussed with orthopedic and he reports was told nothing else can be done so likely to have ongoing pain.   A/P: Patient with history of cervical arthritis with significant flare after fall in February 2023 at work and again this past week with sleeping awkwardly -No significant radicular symptoms so we opted to hold off on prednisone jointly -Can continue Tylenol and ibuprofen but we discussed adjusting doses down slightly particular with Tylenol to max 1000 mg dose at a time and 2000 mg/day -He is extremely stiff in his neck-I think he would benefit from a muscle relaxant-this was sent in for him today and advised him to not drive for 8 hours after taking the Flexeril   #hypothyroidism S: compliant On thyroid medication-levothyroxine 100 mcg  Lab Results  Component Value Date   TSH 2.26 11/29/2022   A/P:he is doing well- due for refill- recheck at physical. Continue current medications for now    Recommended follow up: Return for as needed for new, worsening, persistent symptoms. Future Appointments  Date Time Provider Department Center  12/03/2023  8:00 AM Shelva Majestic, MD [facility]    Lab/Order associations:   ICD-10-CM   1. Cervical spine arthritis  M47.812     2. Hypothyroidism, unspecified type  E03.9     3. Need for influenza vaccination  Z23 Flu vaccine trivalent PF, 6mos and older(Flulaval,Afluria,Fluarix,Fluzone)      Meds ordered this encounter  Medications   cyclobenzaprine (FLEXERIL) 10 MG tablet    Sig: Take 1 tablet (10 mg total) by mouth 3 (three) times daily as needed for muscle spasms.    Dispense:  40 tablet    Refill:  0   levothyroxine (SYNTHROID) 100 MCG tablet    Sig: Take 1 tablet (100 mcg total) by mouth daily.    Dispense:  90 tablet    Refill:  3    Return precautions advised.  [name], MD

## 2023-08-24 NOTE — Patient Instructions (Addendum)
Lets try flexeril up to 3x a day- don't drive for 8 hours after taking  Keep appointment on the 27th for sure- hoping you feel at least some relief by then. Keep doing heat  Recommended follow up: Return for as needed for new, worsening, persistent symptoms. Obviously if things get worse let us know soonest

## 2023-08-31 ENCOUNTER — Ambulatory Visit: Payer: BC Managed Care – PPO | Admitting: Family

## 2023-08-31 VITALS — BP 130/82 | HR 74 | Temp 98.2°F | Ht 68.0 in | Wt 220.0 lb

## 2023-08-31 DIAGNOSIS — J069 Acute upper respiratory infection, unspecified: Secondary | ICD-10-CM | POA: Diagnosis not present

## 2023-08-31 MED ORDER — BENZONATATE 200 MG PO CAPS: 200.0000 mg | ORAL_CAPSULE | Freq: Three times a day (TID) | ORAL | 0 refills | Status: AC | PRN

## 2023-08-31 MED ORDER — NOREL AD 4-10-325 MG PO TABS: 1.0000 | ORAL_TABLET | Freq: Two times a day (BID) | ORAL | Status: DC

## 2023-08-31 NOTE — Progress Notes (Signed)
Patient ID: Christopher Blair, male    DOB: 12/11/1960, 62 y.o.   MRN: [id number]  Chief Complaint  Patient presents with   Sinus Problem    Pt c/o Cough with green mucus, chills, nasal congestion,sore throat and left ear block after having flu shot on Mondays. Has tried sudafed and cold mist nebulizer which did help sx. Covid negative this week.    Discussed the use of AI scribe software for clinical note transcription with the patient, who gave verbal consent to proceed.  History of Present Illness   The patient, with a history of adverse reactions to Mucinex, presents with a cough, congestion, and a sore throat that started on Monday. He describes the cough as uncontrollable and intense, often leading to near fainting episodes. The congestion is severe enough to disrupt sleep, with the patient reporting only six hours of sleep over the past two days. He has been using a nebulizer with saline to help with the congestion, which he reports helps to loosen up the mucus. He has also been taking Sudafed since Tuesday, which he reports helps him relax and opens up his airways. The patient also mentions an earache that has been present for the past few days.     Assessment & Plan:     Upper Respiratory Infection - Persistent cough and congestion since Monday. No fever. Patient has been using over-the-counter saline nebulizer and Sudafed for symptom relief. No history of asthma. Patient reports coughing fits that are intense and uncontrollable, leading to near syncope. -Continue use of saline nebulizer and Sudafed as needed. -Start Tessalon Perles to decrease intensity and frequency of coughing. -Consider use of Benadryl at night to aid sleep. -Provided samples for (Norel AD) to help dry out secretions, 1 pill bid for 3d. -Increase water intake to 2L qd -If symptoms persist or worsen, consider starting steroids. Patient to contact office for follow-up.  Ear Discomfort - Patient reports  intermittent left ear discomfort. On examination, ear appears with mild injection, no s/s infection. -Monitor symptoms and contact office if pain persists or worsens.     Subjective:    Outpatient Medications Prior to Visit  Medication Sig Dispense Refill   ibuprofen (ADVIL,MOTRIN) 200 MG tablet Take 400 mg by mouth every 6 (six) hours as needed for headache or moderate pain (pain score 4-6).     levothyroxine (SYNTHROID) 100 MCG tablet Take 1 tablet (100 mcg total) by mouth daily. 90 tablet 3   cyclobenzaprine (FLEXERIL) 10 MG tablet Take 1 tablet (10 mg total) by mouth 3 (three) times daily as needed for muscle spasms. (Patient not taking: Reported on 08/31/2023) 40 tablet 0   No facility-administered medications prior to visit.   Past Medical History:  Diagnosis Date   Hypothyroidism    Myasthenia gravis 1994   in remission since 1996   Past Surgical History:  Procedure Laterality Date   CHOLECYSTECTOMY N/A 11/26/2017   Procedure: LAPAROSCOPIC CHOLECYSTECTOMY WITH  POSSIBLE INTRAOPERATIVE CHOLANGIOGRAM;  Surgeon: Romie Levee, MD;  Location: [facility];  Service: General;  Laterality: N/A;   ERCP N/A 11/24/2017   Procedure: ENDOSCOPIC RETROGRADE CHOLANGIOPANCREATOGRAPHY (ERCP);  Surgeon: Iva Boop, MD;  Location: [facility];  Service: Gastroenterology;  Laterality: N/A;   ERCP N/A 09/19/2018   Procedure: ENDOSCOPIC RETROGRADE CHOLANGIOPANCREATOGRAPHY (ERCP);  Surgeon: Vida Rigger, MD;  Location: [facility];  Service: Endoscopy;  Laterality: N/A;   REMOVAL OF STONES  09/19/2018   Procedure: REMOVAL OF STONES;  Surgeon: Vida Rigger, MD;  Location: [facility];  Service: Endoscopy;;   SPHINCTEROTOMY  09/19/2018   Procedure: Dennison Mascot;  Surgeon: Vida Rigger, MD;  Location: [facility];  Service: Endoscopy;;   STENT REMOVAL  09/19/2018   Procedure: STENT REMOVAL;  Surgeon: Vida Rigger, MD;  Location: [facility];  Service: Endoscopy;;   TOTAL THYMECTOMY  1994    Allergies  Allergen Reactions   Mucinex [Guaifenesin Er] Nausea And Vomiting      Objective:    Physical Exam Vitals and nursing note reviewed.  Constitutional:      General: He is not in acute distress.    Appearance: Normal appearance.  HENT:     Head: Normocephalic.     Right Ear: Tympanic membrane and ear canal normal.     Left Ear: Ear canal normal. Tympanic membrane is injected. Tympanic membrane is not erythematous or bulging.     Nose:     Right Sinus: Frontal sinus tenderness present. No maxillary sinus tenderness.     Left Sinus: Frontal sinus tenderness present. No maxillary sinus tenderness.     Mouth/Throat:     Mouth: Mucous membranes are moist.     Pharynx: No pharyngeal swelling, oropharyngeal exudate, posterior oropharyngeal erythema or uvula swelling.     Tonsils: No tonsillar exudate or tonsillar abscesses.  Cardiovascular:     Rate and Rhythm: Normal rate and regular rhythm.  Pulmonary:     Effort: Pulmonary effort is normal.     Breath sounds: Normal breath sounds.  Musculoskeletal:        General: Normal range of motion.     Cervical back: Normal range of motion.  Lymphadenopathy:     Head:     Right side of head: No preauricular or posterior auricular adenopathy.     Left side of head: No preauricular or posterior auricular adenopathy.     Cervical: No cervical adenopathy.  Skin:    General: Skin is warm and dry.  Neurological:     Mental Status: He is alert and oriented to person, place, and time.  Psychiatric:        Mood and Affect: Mood normal.    BP 130/82 (BP Location: Left Arm, Patient Position: Sitting, Cuff Size: Normal)   Pulse 74   Temp 98.2 F (36.8 C) (Temporal)   Ht 5\' 8"  (1.727 m)   Wt 220 lb (99.8 kg)   SpO2 95%   BMI 33.45 kg/m  Wt Readings from Last 3 Encounters:  08/31/23 220 lb (99.8 kg)  08/24/23 221 lb (100.2 kg)  08/10/23 218 lb 9.6 oz (99.2 kg)       Dulce Sellar, NP

## 2023-09-04 ENCOUNTER — Telehealth: Payer: Self-pay | Admitting: Family Medicine

## 2023-09-04 DIAGNOSIS — R053 Chronic cough: Secondary | ICD-10-CM

## 2023-09-04 NOTE — Telephone Encounter (Signed)
Pt has not seen [place], he saw [place] on 11/22.

## 2023-09-04 NOTE — Telephone Encounter (Signed)
Prescription Request  09/04/2023  LOV: 08/24/2023  What is the name of the medication or equipment? Pt states the his congestion and cough are not better and he would like an RX for antibiotics. Please advise.  Have you contacted your pharmacy to request a refill? No   Which pharmacy would you like this sent to?  [facility] [address] [place], [place] - [address] [place] [address] Phone: [phone] Fax: [phone]    Patient notified that their request is being sent to the clinical staff for review and that they should receive a response within 2 business days.   Please advise at Mobile [phone] (mobile)

## 2023-09-05 MED ORDER — PREDNISONE 20 MG PO TABS: ORAL_TABLET | ORAL | 0 refills | Status: DC

## 2023-09-05 MED ORDER — AZITHROMYCIN 250 MG PO TABS: ORAL_TABLET | ORAL | 0 refills | Status: AC

## 2023-09-05 NOTE — Telephone Encounter (Signed)
steroid and zpack sent in.

## 2023-09-18 ENCOUNTER — Telehealth: Payer: Self-pay | Admitting: Family Medicine

## 2023-09-18 DIAGNOSIS — R0982 Postnasal drip: Secondary | ICD-10-CM

## 2023-09-18 MED ORDER — TRIAMCINOLONE ACETONIDE 55 MCG/ACT NA AERO: 1.0000 | INHALATION_SPRAY | Freq: Every day | NASAL | 2 refills | Status: AC

## 2023-09-18 NOTE — Telephone Encounter (Signed)
Pt saw [place] on 11/22.

## 2023-09-18 NOTE — Telephone Encounter (Signed)
Patient states his symptoms have improved from Office Visit 08/31/23 except for post nasal drip. Requests a RX for post nasal drip be sent to:  [facility] [address] [place], [place] - [address] Phone: [phone]  Fax: [phone]     Declined to schedule Office Visit

## 2023-09-18 NOTE — Telephone Encounter (Signed)
Sent in Nasacort nasal spray with instructions on bottle. Any continued sx, he needs to f/u with PCP or office visit, thx.

## 2023-09-19 ENCOUNTER — Telehealth: Payer: Self-pay | Admitting: Family Medicine

## 2023-09-19 NOTE — Telephone Encounter (Signed)
I called pt in regards. Pt verbalized understanding, no questions or concerns at this time.

## 2023-09-19 NOTE — Telephone Encounter (Signed)
Noted  

## 2023-09-19 NOTE — Telephone Encounter (Signed)
Patient called after hours line informing them he has been dealing with post nasal drip and cough x a month. Final outcome was to see PCP within 3 days. I called pt to get scheduled with next available provider but he declined for now. States he took some medication last night which has improved sx so far. States he would call back and schedule if needed.     Patient Name First: Christopher Last: York Blair Gender: Male DOB: 1961-06-30 Age: 62 Y 10 M 25 D Return Phone Number: [phone] (Primary) Address: City/ State/ Zip: [place]  [address] Client [facility] - Human resources officer [facility] [employer] Provider [name]- MD Contact Type Call Who Is Calling Patient / Member / Family / Caregiver Call Type Triage / Clinical Relationship To Patient Self Return Phone Number [phone] (Primary) Chief Complaint Cough Reason for Call Symptomatic / Request for Health Information Initial Comment Caller states is experiencing post nasal drip and cough. Translation No Nurse Assessment Nurse: Tori Milks, RN, Marchelle Folks Date/Time (Eastern Time): 09/18/2023 5:31:42 PM Confirm and document reason for call. If symptomatic, describe symptoms. ---Caller states that has been sick for a month- post nasal drip and cough Does the patient have any new or worsening symptoms? ---Yes Will a triage be completed? ---Yes Related visit to physician within the last 2 weeks? ---No Does the PT have any chronic conditions? (i.e. diabetes, asthma, this includes High risk factors for pregnancy, etc.) ---No Is this a behavioral health or substance abuse call? ---No Guidelines Guideline Title Affirmed Question Affirmed Notes Nurse Date/Time (Eastern Time) Cough - Acute NonProductive [1] Also has allergy symptoms (e.g., itchy eyes, clear nasal discharge, postnasal drip) AND [2] they are acting up [employer], RN, Marchelle Folks 09/18/2023  5:32:59 PM Disp. Time Lamount Cohen Time) Disposition Final User 09/18/2023 5:17:10 PM Attempt made - message left Tit[place], RN, Marchelle Folks 09/18/2023 5:36:05 PM SEE PCP WITHIN 3 DAYS Yes Titlow, RN, Marchelle Folks Final Disposition 09/18/2023 5:36:05 PM SEE PCP WITHIN 3 DAYS Yes Titlow, RN, Earnestine Leys Disagree/Comply Comply Caller Understands Yes PreDisposition Call Pharmacist Care Advice Given Per Guideline SEE PCP WITHIN 3 DAYS: * You need to be seen within 2 or 3 days. HUMIDIFIER: * LORATADINE (ALAVERT, CLARITIN): The adult dose is 10 mg. You take it once a day. Loratadine is available in the [place] as Programmer, systems; it is available in [place] as Claritin. * FEXOFENADINE (ALLEGRA): In the [place], the adult dose is one 24-hour tablet (180 mg) once a day. In [place], the adult dose is one 24-hour tablet (120 mg) once a day. Or, you can take one 12-hour (60 mg) tablet 2 times a day. * CETIRIZINE (REACTINE, ZYRTEC): The adult dose is 10 mg. You take it once a day. Cetirizine is available in the [place] as Zyrtec and in [place] as Reactine. * You become worse * Difficulty breathing occurs * Fever over 103 F (39.4 C) CALL BACK IF: CARE ADVICE given per Cough - Acute Non-Productive (Adult) guideline. Referrals REFERRED TO PCP OFFICE

## 2023-09-26 ENCOUNTER — Ambulatory Visit: Payer: BC Managed Care – PPO | Admitting: Family

## 2023-09-26 VITALS — BP 134/88 | HR 76 | Temp 97.7°F | Ht 68.0 in | Wt 222.2 lb

## 2023-09-26 DIAGNOSIS — B349 Viral infection, unspecified: Secondary | ICD-10-CM

## 2023-09-26 DIAGNOSIS — J9801 Acute bronchospasm: Secondary | ICD-10-CM | POA: Diagnosis not present

## 2023-09-26 DIAGNOSIS — R053 Chronic cough: Secondary | ICD-10-CM

## 2023-09-26 MED ORDER — ALBUTEROL SULFATE HFA 108 (90 BASE) MCG/ACT IN AERS: 2.0000 | INHALATION_SPRAY | RESPIRATORY_TRACT | 0 refills | Status: DC

## 2023-09-26 NOTE — Progress Notes (Signed)
Patient ID: Christopher Blair, male    DOB: [birth year], 62 y.o.   MRN: [id number]  Chief Complaint  Patient presents with   Cough    Pt reports throat mucus, Cough with white mucus and nasal/chest congestion, Present for about a month. Has tried z- pack, benadryl,zyrtec and flonase which did help slightly.        Discussed the use of AI scribe software for clinical note transcription with the patient, who gave verbal consent to proceed.  History of Present Illness   The patient presents with a persistent cough that has been ongoing for over a month. The cough is described as a 'hacking cough' that makes it hard for him to breathe and talk. The cough is worse at night and in the morning, and is mostly dry, sometimes white phlegm. He also reports a sensation of tightness in his chest. He has been taking Zyrtec during the day, Flonase during the day, and Benadryl at night, which helps him sleep but has not significantly improved his cough. He also reports that the back of his throat hurts, likely due to the persistent coughing.  He has tried a course of prednisone and antibiotics, and a RX cough syrup, none of which have improved his symptoms. He has no history of asthma. He has also been using a saline mist, which has not provided significant relief. He has not started using Nasacort as he was already using Flonase.     Assessment & Plan:     Persistent Cough - Lungs clear. Persistent cough for over a month, unresponsive to antibiotics, steroids, and cough syrup. No history of asthma. Mucus production is creamy/white. Cough is worse at night and in the morning. -Ordering chest x-ray to rule out pneumonia or other lung pathology. -Prescribing Albuterol inhaler, use in the morning and an hour before bedtime, and as needed every four hours, advised on SE. -Continue Flonase twice daily for postnasal drip, can increase to 2 sprays bid if needed. -Consider referral to Pulmonology if symptoms persist  or if chest x-ray is abnormal.   Subjective:    Outpatient Medications Prior to Visit  Medication Sig Dispense Refill   Chlorphen-PE-Acetaminophen (NOREL AD) 4-10-325 MG TABS Take 1 tablet by mouth 2 (two) times daily.     cyclobenzaprine (FLEXERIL) 10 MG tablet Take 1 tablet (10 mg total) by mouth 3 (three) times daily as needed for muscle spasms. 40 tablet 0   ibuprofen (ADVIL,MOTRIN) 200 MG tablet Take 400 mg by mouth every 6 (six) hours as needed for headache or moderate pain (pain score 4-6).     levothyroxine (SYNTHROID) 100 MCG tablet Take 1 tablet (100 mcg total) by mouth daily. 90 tablet 3   triamcinolone (NASACORT) 55 MCG/ACT AERO nasal inhaler Place 1 spray into the nose daily. Start with 1 spray each side bid x 3 days, then reduce to qd until sx are improved. Ok to remain on daily dose or qod dosing to control sx. 1 each 2   predniSONE (DELTASONE) 20 MG tablet Take 2 pills in the morning with breakfast for 3 days, then 1 pill for 2 days (Patient not taking: Reported on 09/26/2023) 8 tablet 0   No facility-administered medications prior to visit.   Past Medical History:  Diagnosis Date   Hypothyroidism    Myasthenia gravis 1994   in remission since 1996   Past Surgical History:  Procedure Laterality Date   CHOLECYSTECTOMY N/A 11/26/2017   Procedure: LAPAROSCOPIC CHOLECYSTECTOMY WITH  POSSIBLE  INTRAOPERATIVE CHOLANGIOGRAM;  Surgeon: Romie Levee, MD;  Location: [facility];  Service: General;  Laterality: N/A;   ERCP N/A 11/24/2017   Procedure: ENDOSCOPIC RETROGRADE CHOLANGIOPANCREATOGRAPHY (ERCP);  Surgeon: Iva Boop, MD;  Location: [facility];  Service: Gastroenterology;  Laterality: N/A;   ERCP N/A 09/19/2018   Procedure: ENDOSCOPIC RETROGRADE CHOLANGIOPANCREATOGRAPHY (ERCP);  Surgeon: Vida Rigger, MD;  Location: [facility];  Service: Endoscopy;  Laterality: N/A;   REMOVAL OF STONES  09/19/2018   Procedure: REMOVAL OF STONES;  Surgeon: Vida Rigger, MD;  Location: [facility];  Service: Endoscopy;;   SPHINCTEROTOMY  09/19/2018   Procedure: Dennison Mascot;  Surgeon: Vida Rigger, MD;  Location: [facility];  Service: Endoscopy;;   STENT REMOVAL  09/19/2018   Procedure: STENT REMOVAL;  Surgeon: Vida Rigger, MD;  Location: [facility];  Service: Endoscopy;;   TOTAL THYMECTOMY  1994   Allergies  Allergen Reactions   Mucinex [Guaifenesin Er] Nausea And Vomiting      Objective:    Physical Exam Vitals and nursing note reviewed.  Constitutional:      General: He is not in acute distress.    Appearance: Normal appearance.  HENT:     Head: Normocephalic.     Right Ear: Tympanic membrane and ear canal normal.     Left Ear: Tympanic membrane and ear canal normal.     Nose:     Right Sinus: No maxillary sinus tenderness or frontal sinus tenderness.     Left Sinus: No maxillary sinus tenderness or frontal sinus tenderness.     Mouth/Throat:     Mouth: Mucous membranes are moist.     Pharynx: Postnasal drip present. No pharyngeal swelling, oropharyngeal exudate, posterior oropharyngeal erythema or uvula swelling.     Tonsils: No tonsillar exudate or tonsillar abscesses.  Cardiovascular:     Rate and Rhythm: Normal rate and regular rhythm.  Pulmonary:     Effort: Pulmonary effort is normal.     Breath sounds: Normal breath sounds.  Musculoskeletal:        General: Normal range of motion.     Cervical back: Normal range of motion.  Lymphadenopathy:     Head:     Right side of head: No preauricular or posterior auricular adenopathy.     Left side of head: No preauricular or posterior auricular adenopathy.     Cervical: No cervical adenopathy.  Skin:    General: Skin is warm and dry.  Neurological:     Mental Status: He is alert and oriented to person, place, and time.  Psychiatric:        Mood and Affect: Mood normal.    BP 134/88 (BP Location: Left Arm, Patient Position: Sitting, Cuff Size: Large)   Pulse 76   Temp 97.7 F (36.5 C)  (Temporal)   Ht 5\' 8"  (1.727 m)   Wt 222 lb 4 oz (100.8 kg)   SpO2 94%   BMI 33.79 kg/m  Wt Readings from Last 3 Encounters:  09/26/23 222 lb 4 oz (100.8 kg)  08/31/23 220 lb (99.8 kg)  08/24/23 221 lb (100.2 kg)      Dulce Sellar, NP

## 2023-09-27 ENCOUNTER — Ambulatory Visit
Admission: RE | Admit: 2023-09-27 | Discharge: 2023-09-27 | Disposition: A | Discharge status: Home / Self Care | Payer: BC Managed Care – PPO | Source: Ambulatory Visit | Attending: Family | Admitting: Family

## 2023-09-27 DIAGNOSIS — R053 Chronic cough: Secondary | ICD-10-CM

## 2023-10-08 ENCOUNTER — Encounter: Payer: Self-pay | Admitting: Family

## 2023-10-08 ENCOUNTER — Encounter: Payer: Self-pay | Admitting: Family Medicine

## 2023-10-09 NOTE — Telephone Encounter (Signed)
Called pt and informed him that once results come through, he would be receiving call from PCP office on next steps. Pt verbalized understanding.

## 2023-10-09 NOTE — Telephone Encounter (Unsigned)
 Copied from CRM [id number]. Topic: Clinical - Lab/Test Results >> Oct 09, 2023 10:05 AM Gurney Maxin H wrote: Reason for CRM: Patient is calling to receive results from his xray, please reach out to patient.   [place]ion [phone]

## 2023-11-16 ENCOUNTER — Other Ambulatory Visit: Payer: Self-pay | Admitting: Family Medicine

## 2023-11-16 ENCOUNTER — Ambulatory Visit: Payer: Self-pay | Admitting: Family Medicine

## 2023-11-16 DIAGNOSIS — R053 Chronic cough: Secondary | ICD-10-CM

## 2023-11-16 DIAGNOSIS — B349 Viral infection, unspecified: Secondary | ICD-10-CM

## 2023-11-16 MED ORDER — ALBUTEROL SULFATE HFA 108 (90 BASE) MCG/ACT IN AERS: 2.0000 | INHALATION_SPRAY | RESPIRATORY_TRACT | 0 refills | Status: DC

## 2023-11-16 NOTE — Telephone Encounter (Signed)
 Please call and schedule ov for pt to be evaluated.

## 2023-11-16 NOTE — Telephone Encounter (Signed)
 Copied from CRM [id number]. Topic: Clinical - Red Word Triage >> Nov 16, 2023  8:47 AM Christopher Blair wrote: Red Word that prompted transfer to Nurse Triage: Congested: heavy chest and green phlegm coming from nose.  Chief Complaint: cough congestion Symptoms: green phlegm Frequency: constant Pertinent Negatives: Patient denies fever, sob Disposition: [] ED /[] Urgent Care (no appt availability in office) / [] Appointment(In office/virtual)/ []  [facility]/ [x] Home Care/ [] Refused Recommended Disposition /[] [facility]/ []  Follow-up with PCP Additional Notes: Patient states had this a month ago and would like albuterol  reordered.  Declined need for apt.  Message sent to office, instructed to go to er if becomes worse.   Reason for Disposition  Cough  Answer Assessment - Initial Assessment Questions 1. ONSET: When did the cough begin?      sunday 2. SEVERITY: How bad is the cough today?      moderate 3. SPUTUM: Describe the color of your sputum (none, dry cough; clear, white, yellow, green)     Green phlegm 4. HEMOPTYSIS: Are you coughing up any blood? If so ask: How much? (flecks, streaks, tablespoons, etc.)     denies 5. DIFFICULTY BREATHING: Are you having difficulty breathing? If Yes, ask: How bad is it? (e.g., mild, moderate, severe)    - MILD: No SOB at rest, mild SOB with walking, speaks normally in sentences, can lie down, no retractions, pulse < 100.    - MODERATE: SOB at rest, SOB with minimal exertion and prefers to sit, cannot lie down flat, speaks in phrases, mild retractions, audible wheezing, pulse 100-120.    - SEVERE: Very SOB at rest, speaks in single words, struggling to breathe, sitting hunched forward, retractions, pulse > 120      Denies, only when walking up and down stairs 6. FEVER: Do you have a fever? If Yes, ask: What is your temperature, how was it measured, and when did it start?     denies 7. CARDIAC HISTORY: Do you have any  history of heart disease? (e.g., heart attack, congestive heart failure)      denies 8. LUNG HISTORY: Do you have any history of lung disease?  (e.g., pulmonary embolus, asthma, emphysema)     denies 9. PE RISK FACTORS: Do you have a history of blood clots? (or: recent major surgery, recent prolonged travel, bedridden)     denies 10. OTHER SYMPTOMS: Do you have any other symptoms? (e.g., runny nose, wheezing, chest pain)       wheezing 11. PREGNANCY: Is there any chance you are pregnant? When was your last menstrual period?       na 12. TRAVEL: Have you traveled out of the country in the last month? (e.g., travel history, exposures)       na  Protocols used: Cough - Acute Productive-A-AH

## 2023-11-16 NOTE — Telephone Encounter (Signed)
 Copied from CRM [id number]. Topic: Clinical - Medication Refill >> Nov 16, 2023 12:48 PM China J wrote: Most Recent Primary Care Visit:  Provider: LUCIUS KRABBE  Department: [facility]  Visit Type: SAME DAY  Date: 09/26/2023  Medication: albuterol   Has the patient contacted their pharmacy? No (Agent: If no, request that the patient contact the pharmacy for the refill. If patient does not wish to contact the pharmacy document the reason why and proceed with request.) (Agent: If yes, when and what did the pharmacy advise?) Patient chose not to contact pharmacy. Wanted to contact clinic directly.  Is this the correct pharmacy for this prescription? Yes If no, delete pharmacy and type the correct one.  This is the patient's preferred pharmacy:  [facility] [address] [place] - [address] [place] [address] Phone: [phone] Fax: [phone]   Has the prescription been filled recently? No  Is the patient out of the medication? Yes  Has the patient been seen for an appointment in the last year OR does the patient have an upcoming appointment? Yes  Can we respond through MyChart? Yes  Agent: Please be advised that Rx refills may take up to 3 business days. We ask that you follow-up with your pharmacy.

## 2023-11-19 NOTE — Telephone Encounter (Signed)
 Please see pt response.

## 2023-12-03 ENCOUNTER — Ambulatory Visit (INDEPENDENT_AMBULATORY_CARE_PROVIDER_SITE_OTHER): Payer: 59 | Admitting: Family Medicine

## 2023-12-03 ENCOUNTER — Encounter: Payer: Self-pay | Admitting: Family Medicine

## 2023-12-03 VITALS — BP 124/70 | HR 77 | Temp 97.5°F | Ht 68.0 in | Wt 220.4 lb

## 2023-12-03 DIAGNOSIS — Z0000 Encounter for general adult medical examination without abnormal findings: Secondary | ICD-10-CM

## 2023-12-03 DIAGNOSIS — R739 Hyperglycemia, unspecified: Secondary | ICD-10-CM

## 2023-12-03 DIAGNOSIS — Z125 Encounter for screening for malignant neoplasm of prostate: Secondary | ICD-10-CM | POA: Diagnosis not present

## 2023-12-03 DIAGNOSIS — E785 Hyperlipidemia, unspecified: Secondary | ICD-10-CM | POA: Diagnosis not present

## 2023-12-03 DIAGNOSIS — Z131 Encounter for screening for diabetes mellitus: Secondary | ICD-10-CM | POA: Diagnosis not present

## 2023-12-03 DIAGNOSIS — E039 Hypothyroidism, unspecified: Secondary | ICD-10-CM | POA: Diagnosis not present

## 2023-12-03 LAB — LIPID PANEL
Cholesterol: 253 mg/dL — ABNORMAL HIGH (ref 0–200)
HDL: 39.2 mg/dL (ref >39.00)
LDL Cholesterol: 146 mg/dL — ABNORMAL HIGH (ref 0–99)
NonHDL: 213.72
Total CHOL/HDL Ratio: 6
Triglycerides: 339 mg/dL — ABNORMAL HIGH (ref 0.0–149.0)
VLDL: 67.8 mg/dL — ABNORMAL HIGH (ref 0.0–40.0)

## 2023-12-03 LAB — COMPREHENSIVE METABOLIC PANEL
ALT: 53 U/L (ref 0–53)
AST: 32 U/L (ref 0–37)
Albumin: 4.3 g/dL (ref 3.5–5.2)
Alkaline Phosphatase: 80 U/L (ref 39–117)
BUN: 14 mg/dL (ref 6–23)
CO2: 26 meq/L (ref 19–32)
Calcium: 9 mg/dL (ref 8.4–10.5)
Chloride: 103 meq/L (ref 96–112)
Creatinine, Ser: 1.07 mg/dL (ref 0.40–1.50)
GFR: 74.06 mL/min (ref >60.00)
Glucose, Bld: 125 mg/dL — ABNORMAL HIGH (ref 70–99)
Potassium: 4.3 meq/L (ref 3.5–5.1)
Sodium: 140 meq/L (ref 135–145)
Total Bilirubin: 1.2 mg/dL (ref 0.2–1.2)
Total Protein: 6.9 g/dL (ref 6.0–8.3)

## 2023-12-03 LAB — CBC WITH DIFFERENTIAL/PLATELET
Basophils Absolute: 0.1 10*3/uL (ref 0.0–0.1)
Basophils Relative: 1 % (ref 0.0–3.0)
Eosinophils Absolute: 0.3 10*3/uL (ref 0.0–0.7)
Eosinophils Relative: 4.3 % (ref 0.0–5.0)
HCT: 45.7 % (ref 39.0–52.0)
Hemoglobin: 16 g/dL (ref 13.0–17.0)
Lymphocytes Relative: 26 % (ref 12.0–46.0)
Lymphs Abs: 1.6 10*3/uL (ref 0.7–4.0)
MCHC: 34.9 g/dL (ref 30.0–36.0)
MCV: 91.2 fL (ref 78.0–100.0)
Monocytes Absolute: 0.6 10*3/uL (ref 0.1–1.0)
Monocytes Relative: 9.2 % (ref 3.0–12.0)
Neutro Abs: 3.7 10*3/uL (ref 1.4–7.7)
Neutrophils Relative %: 59.5 % (ref 43.0–77.0)
Platelets: 274 10*3/uL (ref 150.0–400.0)
RBC: 5.02 Mil/uL (ref 4.22–5.81)
RDW: 13 % (ref 11.5–15.5)
WBC: 6.2 10*3/uL (ref 4.0–10.5)

## 2023-12-03 LAB — PSA: PSA: 1.38 ng/mL (ref 0.10–4.00)

## 2023-12-03 LAB — HEMOGLOBIN A1C: Hgb A1c MFr Bld: 6.6 % — ABNORMAL HIGH (ref 4.6–6.5)

## 2023-12-03 LAB — TSH: TSH: 10.3 u[IU]/mL — ABNORMAL HIGH (ref 0.35–5.50)

## 2023-12-03 MED ORDER — LEVOTHYROXINE SODIUM 100 MCG PO TABS: 100.0000 ug | ORAL_TABLET | Freq: Every day | ORAL | 3 refills | Status: DC

## 2023-12-03 NOTE — Addendum Note (Signed)
 Addended by: Shelva Majestic on: 12/03/2023 08:42 PM   Modules accepted: Orders

## 2023-12-03 NOTE — Progress Notes (Signed)
 Phone: [phone]   Subjective:  Patient presents today for their annual physical. Chief complaint-noted.   See problem oriented charting- ROS- full  review of systems was completed and negative  except for: ongoing neck pain issues  The following were reviewed and entered/updated in epic: Past Medical History:  Diagnosis Date   Hypothyroidism    Myasthenia gravis 1994   in remission since 1996   Patient Active Problem List   Diagnosis Date Noted   Hyperlipidemia 07/29/2015    Priority: Medium    Family history of MI (myocardial infarction) 10/19/2014    Priority: Medium    Hypothyroidism 01/05/2014    Priority: Medium    History of adenomatous polyp of colon 02/07/2018    Priority: Low   Gallstone pancreatitis 11/22/2017    Priority: Low   History of myasthenia gravis 02/08/2010    Priority: Low   Past Surgical History:  Procedure Laterality Date   CHOLECYSTECTOMY N/A 11/26/2017   Procedure: LAPAROSCOPIC CHOLECYSTECTOMY WITH  POSSIBLE INTRAOPERATIVE CHOLANGIOGRAM;  Surgeon: Romie Levee, MD;  Location: [facility];  Service: General;  Laterality: N/A;   ERCP N/A 11/24/2017   Procedure: ENDOSCOPIC RETROGRADE CHOLANGIOPANCREATOGRAPHY (ERCP);  Surgeon: Iva Boop, MD;  Location: [facility];  Service: Gastroenterology;  Laterality: N/A;   ERCP N/A 09/19/2018   Procedure: ENDOSCOPIC RETROGRADE CHOLANGIOPANCREATOGRAPHY (ERCP);  Surgeon: Vida Rigger, MD;  Location: [facility];  Service: Endoscopy;  Laterality: N/A;   REMOVAL OF STONES  09/19/2018   Procedure: REMOVAL OF STONES;  Surgeon: Vida Rigger, MD;  Location: [facility];  Service: Endoscopy;;   SPHINCTEROTOMY  09/19/2018   Procedure: Dennison Mascot;  Surgeon: Vida Rigger, MD;  Location: [facility];  Service: Endoscopy;;   STENT REMOVAL  09/19/2018   Procedure: STENT REMOVAL;  Surgeon: Vida Rigger, MD;  Location: [facility];  Service: Endoscopy;;   TOTAL THYMECTOMY  1994    Family History  Problem  Relation Age of Onset   Cancer Mother 10       breast ca   Heart disease Father        AMI-61, nonsmoker, died 75   Migraines Son        and tics   Anxiety disorder Daughter     Medications- reviewed and updated Current Outpatient Medications  Medication Sig Dispense Refill   triamcinolone (NASACORT) 55 MCG/ACT AERO nasal inhaler Place 1 spray into the nose daily. Start with 1 spray each side bid x 3 days, then reduce to qd until sx are improved. Ok to remain on daily dose or qod dosing to control sx. 1 each 2   ibuprofen (ADVIL,MOTRIN) 200 MG tablet Take 400 mg by mouth every 6 (six) hours as needed for headache or moderate pain (pain score 4-6). (Patient not taking: Reported on 12/03/2023)     levothyroxine (SYNTHROID) 100 MCG tablet Take 1 tablet (100 mcg total) by mouth daily. 90 tablet 3   No current facility-administered medications for this visit.    Allergies-reviewed and updated Allergies  Allergen Reactions   Mucinex [Guaifenesin Er] Nausea And Vomiting    Social History   Social History Narrative   Married. 3 children.  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  Works side jobs as well.       Hobbies:  essentially no hobbies as works frequently   Goes to [employer]    Objective  Objective:  BP 124/70   Pulse 77   Temp (!) 97.5 F (36.4 C)   Ht 5\' 8"  (1.727 m)   Wt 220 lb 6.4 oz (100 kg)   SpO2 96%   BMI 33.51 kg/m  Gen: NAD, resting comfortably HEENT: Mucous membranes are moist. Oropharynx normal Neck: no thyromegaly CV: RRR no murmurs rubs or gallops Lungs: CTAB no crackles, wheeze, rhonchi Abdomen: soft/nontender/nondistended/normal bowel sounds. No rebound or guarding.  Ext: no edema Skin: warm,  dry Neuro: grossly normal, moves all extremities, PERRLA Declines genitourinary and rectal exam   Assessment and Plan  63 y.o. male presenting for annual physical.  Health Maintenance counseling: 1. Anticipatory guidance: Patient counseled regarding regular dental exams -q6 months, eye exams - appointment next week,  avoiding smoking and second hand smoke , limiting alcohol to 2 beverages per day - maybe a beer a month, no illicit drugs .   2. Risk factor reduction:  Advised patient of need for regular exercise and diet rich and fruits and vegetables to reduce risk of heart attack and stroke.  Exercise- ill in last month with respiratory illness that set him back- he reports improving. Upper respiratory infection (URI) in November then persistent cough in December and later had to see urgent care- given 6 days of prednisone Diet/weight management-weight within 1 pound of last year- feels prednisone was big barrier and hoping can get some weight loss with less appetite and more movement.  Wt Readings from Last 3 Encounters:  12/03/23 220 lb 6.4 oz (100 kg)  09/26/23 222 lb 4 oz (100.8 kg)  08/31/23 220 lb (99.8 kg)  3. Immunizations/screenings/ancillary studies-declines flu, offered final Shingrix- wants to wait for now with recent illness just getting over  Immunization History  Administered Date(s) Administered   Influenza Inj Mdck Quad With Preservative 10/17/2017   Influenza, Seasonal, Injecte, Preservative Fre 08/24/2023   Influenza,inj,Quad PF,6+ Mos 07/29/2015, 11/08/2016, 07/23/2020, 06/20/2022   Influenza-Unspecified 07/18/2017   PFIZER(Purple Top)SARS-COV-2 Vaccination 12/06/2019, 12/27/2019, 07/31/2020   Td 06/01/2009   Tdap 11/29/2022   Zoster Recombinant(Shingrix) 11/29/2022  4. Prostate cancer screening-  low risk prior trend- update psa today  Lab Results  Component Value Date   PSA 1.04 11/29/2022   PSA 0.76 01/01/2019   PSA 0.89 11/08/2016   5. Colon cancer screening -  04/17/2022 with 5-year repeat per GI 6. Skin cancer screening-no dermatologist. advised regular sunscreen use. Denies worrisome, changing, or new skin lesions.  7. Smoking associated screening (lung cancer screening, AAA screen 65-75, UA)-never smoker 8. STD screening -only active with wife  Status of chronic or acute concerns   # Cervical arthritis-significant flareup at last visit in November without radicular symptoms-trial of muscle relaxant at bedtime -Also had prior work-related injuries that he was working through last year  (injury about 2 years ago) with PT-multiple injections in his knee with Dr. Althea Charon and needed injections in his thumb - Had not catered since the prior December at last February visit  #hyperlipidemia-father with CAD at 57 S: Medication:None and has declined CT calcium scoring in the past The 10-year ASCVD risk score (Arnett DK, et al., 2019) is: 11.9% Lab Results  Component Value Date   CHOL 217 (H) 06/20/2022   HDL 44.40 06/20/2022   LDLCALC 150 (H) 06/20/2022   LDLDIRECT 105.0 10/27/2015   TRIG 111.0 06/20/2022   CHOLHDL 5 06/20/2022  A/P: if #s similar this year he's open to pursuing CT calcium scoring for $150- we will reach out after get results- I will order.     # Hyperglycemia/insulin resistance/prediabetes-peak A1c 6.3 S:  Medication: none Lab Results  Component Value Date   HGBA1C 6.3 11/29/2022   A/P: hopefully stable- update a1c today. On no meds  #hypothyroidism S: compliant On thyroid medication-levothyroxine 100 mcg Lab Results  Component Value Date   TSH 2.26 11/29/2022   A/P:Hopefuly stable- update tsh with labs today. Continue current meds for now.  Recommended follow up: Return in about 1 year (around 12/02/2024) for physical or sooner if needed.Schedule b4 you leave.  Lab/Order associations: fasting   ICD-10-CM   1. Preventative health care  Z00.00     2. Hyperlipidemia, unspecified hyperlipidemia type  E78.5 Comprehensive  metabolic panel    CBC with Differential/Platelet    Lipid panel    3. Hypothyroidism, unspecified type  E03.9 TSH    4. Screening for prostate cancer  Z12.5 PSA    5. Hyperglycemia  R73.9 Hemoglobin A1c    6. Screening for diabetes mellitus  Z13.1 Hemoglobin A1c     Meds ordered this encounter  Medications   levothyroxine (SYNTHROID) 100 MCG tablet    Sig: Take 1 tablet (100 mcg total) by mouth daily.    Dispense:  90 tablet    Refill:  3    Return precautions advised.  [name], MD

## 2023-12-03 NOTE — Patient Instructions (Addendum)
 Please stop by lab before you go If you have mychart- we will send your results within 3 business days of [place] receiving them.  If you do not have mychart- we will call you about results within 5 business days of [place] receiving them.  *please also note that you will see labs on mychart as soon as they post. I will later go in and write notes on them- will say "notes from Dr. Durene Cal"   Recommended follow up: Return in about 1 year (around 12/02/2024) for physical or sooner if needed.Schedule b4 you leave.

## 2023-12-05 ENCOUNTER — Other Ambulatory Visit: Payer: Self-pay

## 2023-12-05 DIAGNOSIS — E039 Hypothyroidism, unspecified: Secondary | ICD-10-CM

## 2023-12-28 ENCOUNTER — Ambulatory Visit
Admission: RE | Admit: 2023-12-28 | Discharge: 2023-12-28 | Disposition: A | Discharge status: Home / Self Care | Payer: 59 | Source: Ambulatory Visit | Attending: Family Medicine | Admitting: Family Medicine

## 2023-12-28 DIAGNOSIS — E785 Hyperlipidemia, unspecified: Secondary | ICD-10-CM

## 2024-01-01 ENCOUNTER — Encounter: Payer: Self-pay | Admitting: Family Medicine

## 2024-01-14 ENCOUNTER — Other Ambulatory Visit (INDEPENDENT_AMBULATORY_CARE_PROVIDER_SITE_OTHER): Payer: Self-pay

## 2024-01-14 ENCOUNTER — Encounter: Payer: Self-pay | Admitting: Family Medicine

## 2024-01-14 DIAGNOSIS — E039 Hypothyroidism, unspecified: Secondary | ICD-10-CM | POA: Diagnosis not present

## 2024-01-14 LAB — TSH: TSH: 8.96 u[IU]/mL — ABNORMAL HIGH (ref 0.35–5.50)

## 2024-01-15 ENCOUNTER — Other Ambulatory Visit: Payer: Self-pay

## 2024-01-15 DIAGNOSIS — E039 Hypothyroidism, unspecified: Secondary | ICD-10-CM

## 2024-01-15 MED ORDER — LEVOTHYROXINE SODIUM 112 MCG PO TABS: 112.0000 ug | ORAL_TABLET | Freq: Every day | ORAL | 5 refills | Status: DC

## 2024-01-15 MED ORDER — ROSUVASTATIN CALCIUM 10 MG PO TABS: 10.0000 mg | ORAL_TABLET | Freq: Every day | ORAL | 3 refills | Status: AC

## 2024-01-17 ENCOUNTER — Telehealth: Payer: Self-pay

## 2024-01-17 NOTE — Telephone Encounter (Signed)
 Called and lm on pt vm that yes, Dr. Durene Cal sent in the rx for rosuvastatin 10mg  on 01/15/24 that he should be taking.  Copied from CRM [id number]. Topic: Clinical - Prescription Issue >> Jan 16, 2024  5:06 PM Eunice Blase wrote: Reason for CRM: Pt want to make sure it's okay to take rosuvastatin (CRESTOR) 10 MG tablet. Please call pt at [phone]

## 2024-01-17 NOTE — Telephone Encounter (Signed)
 See below

## 2024-01-17 NOTE — Telephone Encounter (Signed)
 Patient requests to be called re: Christopher Blair interact with Christopher Blair?

## 2024-01-17 NOTE — Telephone Encounter (Signed)
 There is a very small chance that this could agitate myasthenia gravis-thankfully his symptoms have not been active in years that I am aware.  I would still lean toward him trying the medicine

## 2024-01-18 NOTE — Telephone Encounter (Signed)
 Message has been communicated to pt via mychart.

## 2024-02-25 ENCOUNTER — Other Ambulatory Visit (INDEPENDENT_AMBULATORY_CARE_PROVIDER_SITE_OTHER)

## 2024-02-25 DIAGNOSIS — E039 Hypothyroidism, unspecified: Secondary | ICD-10-CM | POA: Diagnosis not present

## 2024-02-26 ENCOUNTER — Ambulatory Visit: Payer: Self-pay | Admitting: Family Medicine

## 2024-02-26 LAB — TSH: TSH: 1.63 u[IU]/mL (ref 0.35–5.50)

## 2024-02-27 ENCOUNTER — Other Ambulatory Visit: Payer: Self-pay

## 2024-02-27 MED ORDER — LEVOTHYROXINE SODIUM 112 MCG PO TABS: 112.0000 ug | ORAL_TABLET | Freq: Every day | ORAL | 3 refills | Status: AC

## 2024-03-10 ENCOUNTER — Telehealth: Payer: Self-pay

## 2024-03-10 NOTE — Telephone Encounter (Signed)
 Copied from CRM [id number]. Topic: Clinical - Prescription Issue >> Mar 07, 2024  9:49 AM Clyde Darling P wrote: Reason for CRM: Pt advise he was at pharmacy to pick up levothyroxine  (SYNTHROID ) 112 MCG tablet but they advise they do not have a prescription yet for 90 DS  Contacted [facility] pharmacy for patient, was advised medication was out of stock but prescription is now ready for patient. Called pt to advise and states he is going to pick up today. Nothing further needed at this time.

## 2024-03-10 NOTE — Telephone Encounter (Signed)
 Copied from CRM [id number]. Topic: General - Call Back - No Documentation >> Mar 07, 2024 12:07 PM Luane Rumps D wrote: Reason for CRM: Patient returning call, no documentation of who had called but patient would like a call back.  Duplicate msg, issue resolved. See previous CRM note

## 2024-09-29 ENCOUNTER — Ambulatory Visit: Admitting: Family Medicine

## 2024-09-29 ENCOUNTER — Encounter: Payer: Self-pay | Admitting: Family Medicine

## 2024-09-29 ENCOUNTER — Other Ambulatory Visit (HOSPITAL_COMMUNITY)
Admission: RE | Admit: 2024-09-29 | Discharge: 2024-09-29 | Disposition: A | Discharge status: Home / Self Care | Source: Ambulatory Visit | Attending: Family Medicine | Admitting: Family Medicine

## 2024-09-29 VITALS — BP 112/70 | HR 81 | Temp 97.8°F | Ht 68.0 in | Wt 220.8 lb

## 2024-09-29 DIAGNOSIS — D485 Neoplasm of uncertain behavior of skin: Secondary | ICD-10-CM

## 2024-09-29 DIAGNOSIS — L989 Disorder of the skin and subcutaneous tissue, unspecified: Secondary | ICD-10-CM | POA: Diagnosis not present

## 2024-09-29 DIAGNOSIS — M25512 Pain in left shoulder: Secondary | ICD-10-CM | POA: Diagnosis not present

## 2024-09-29 DIAGNOSIS — G8929 Other chronic pain: Secondary | ICD-10-CM

## 2024-09-29 DIAGNOSIS — R7303 Prediabetes: Secondary | ICD-10-CM | POA: Diagnosis not present

## 2024-09-29 DIAGNOSIS — M25511 Pain in right shoulder: Secondary | ICD-10-CM | POA: Diagnosis not present

## 2024-09-29 NOTE — Patient Instructions (Addendum)
 I'm concerned about diabetes developing but you wanted to hold on labs today- agreed to do at physical- go ahead and schedule  You had a biopsy today- leave bandage in place for 24 hours. May then remove and wash with soapy water but not scrub. After bathing, reapply triple antibiotic ointment and bandaid.  If it bleeds- apply pressure up to 30 minutes to an hour- seek care if continues to bleed past that point. If you have expanding redness or worsening pain then let us  reevaluate the spot.   We have ordered labs or studies at this visit (pathology of lesion). It can take up to 1-2 weeks for results and processing. IF results require follow up or explanation, we will call you with instructions. Clinically stable results will be released to your [facility]. If you have not heard from us  or cannot find your results in [facility] in 2 weeks please contact our office at [phone].  Recommended follow up: Return in about 4 months (around 01/28/2025) for physical or sooner if needed.Schedule b4 you leave.

## 2024-09-29 NOTE — Progress Notes (Signed)
 " Phone [phone] In person visit   Subjective:   Christopher Blair is a 63 y.o. year old very pleasant male patient who presents for/with See problem oriented charting Chief Complaint  Patient presents with   Skin Tag Removal    Wife noticed skin tag x3 months ago;    Shoulder Pain    Bilateral shoulder pain;    Past Medical History-  Patient Active Problem List   Diagnosis Date Noted   Hyperlipidemia 07/29/2015    Priority: Medium    Family history of MI (myocardial infarction) 10/19/2014    Priority: Medium    Hypothyroidism 01/05/2014    Priority: Medium    History of adenomatous polyp of colon 02/07/2018    Priority: Low   Gallstone pancreatitis 11/22/2017    Priority: Low   History of myasthenia gravis 02/08/2010    Priority: Low    Medications- reviewed and updated Current Outpatient Medications  Medication Sig Dispense Refill   levothyroxine  (SYNTHROID ) 112 MCG tablet Take 1 tablet (112 mcg total) by mouth daily. 90 tablet 3   rosuvastatin  (CRESTOR ) 10 MG tablet Take 1 tablet (10 mg total) by mouth daily. 90 tablet 3   triamcinolone  (NASACORT ) 55 MCG/ACT AERO nasal inhaler Place 1 spray into the nose daily. Start with 1 spray each side bid x 3 days, then reduce to qd until sx are improved. Ok to remain on daily dose or qod dosing to control sx. 1 each 2   ibuprofen (ADVIL,MOTRIN) 200 MG tablet Take 400 mg by mouth every 6 (six) hours as needed for headache or moderate pain (pain score 4-6). (Patient not taking: Reported on 09/29/2024)     No current facility-administered medications for this visit.     Objective:  BP 112/70 (BP Location: Left Arm, Patient Position: Sitting, Cuff Size: Normal)   Pulse 81   Temp 97.8 F (36.6 C) (Temporal)   Ht 5' 8 (1.727 m)   Wt 220 lb 12.8 oz (100.2 kg)   SpO2 94%   BMI 33.57 kg/m  Gen: NAD, resting comfortably CV: RRR no murmurs rubs or gallops Lungs: CTAB no crackles, wheeze, rhonchi Ext: no edema Skin: warm,  dry, left upper back at least 0.5 0.5 cm lesion base with 1 cm in length- possible skin tag  Removal Completed today. 0.5 cc of lidocaine  with epinephrine  used to anesthetize. Then betadine used to further cleanse before using pick ups to pick up lesion and scissors to cut the base. Hemostasis achieved with silver nitrate. Patient tolerated procedure well. Verbal consent before visit and post procedure aftercare was discussed.    Skin Biopsy Procedure Note   PRE-OP DIAGNOSIS:  skin lesion (presumed skin tag but concerned with growth) POST-OP DIAGNOSIS: Same  Size of lesion: 1 cm with 0.5 cm base x 0.5 cm base Location of lesion left upper back PROCEDURE: Shave Biopsy        We discussed risks of procedure and obtained verbal consent . The area surrounding the skin lesion was prepared and draped in the usual sterile manner. The lesion was removed in the usual manner by the biopsy method noted above. Hemostasis was assured. The patient tolerated the procedure well.  Closure: silver nitrate  Followup: The patient tolerated the procedure well without complications.  Standard post-procedure care is explained and return precautions are given.       Assessment and Plan   # Bilateral shoulder pain S:since prior injury in last 6-7 months getting numbness in both fingers 1-3  and then shoulders if laying on his side and opposite arm is numb - tries to lay on bak but wakes up with this. Still only sleeping 4-5 hours a night. With driving finger tips may go numb.   -He has a history of known cervical arthritis and has had injections in the past.  -ongoing arthritis in the thumbs- prior thumb fracture- still limied to 25 lbs lifting A/P: he plans to get back in with [facility]- may need more injections but really up to them- he will call to schedule.  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   # Skin tag S: Marked a skin tag about 3 months ago on his back (had  prior back injections In neck but this is off to side).  He reports  catches on clothes fair amount which can be irritated. Seems to be enlarging.  A/P: due to the fact that this continues to enlarge and is catching on his shirt for both reasons will remove today and send for pathology with continued growth to rule out malignancy   # Hyperglycemia/insulin resistance/prediabetes S:  Medication: none Lab Results  Component Value Date   HGBA1C 6.6 (H) 12/03/2023   HGBA1C 6.3 11/29/2022    A/P: we discussed prediabetes- he wants to work on healthy eating and regular exercise and understand if has another 6.5 we will need to diagnose diabetes- he agrees to schedule follow up   Recommended follow up: Return in about 4 months (around 01/28/2025) for physical or sooner if needed.Schedule b4 you leave.   Lab/Order associations:   ICD-10-CM   1. Neoplasm of uncertain behavior of skin  D48.5 Surgical pathology    2. Skin lesion  L98.9 Surgical pathology    3. Chronic pain of both shoulders  M25.511    G89.29    M25.512     4. Prediabetes  R73.03       No orders of the defined types were placed in this encounter.   Return precautions advised.  Garnette Lukes, MD  "

## 2024-10-01 LAB — SURGICAL PATHOLOGY

## 2024-10-06 ENCOUNTER — Ambulatory Visit: Payer: Self-pay | Admitting: Family Medicine
